# Patient Record
Sex: Female | Born: 1935
Health system: Southern US, Community
[De-identification: ages and names within clinical notes are randomized; demographics above are authoritative.]

## PROBLEM LIST (undated history)

## (undated) DIAGNOSIS — I10 Essential (primary) hypertension: Secondary | ICD-10-CM

## (undated) DIAGNOSIS — E039 Hypothyroidism, unspecified: Secondary | ICD-10-CM

## (undated) DIAGNOSIS — IMO0001 Reserved for inherently not codable concepts without codable children: Secondary | ICD-10-CM

## (undated) DIAGNOSIS — R233 Spontaneous ecchymoses: Secondary | ICD-10-CM

## (undated) DIAGNOSIS — K219 Gastro-esophageal reflux disease without esophagitis: Secondary | ICD-10-CM

## (undated) DIAGNOSIS — R238 Other skin changes: Secondary | ICD-10-CM

## (undated) DIAGNOSIS — M1990 Unspecified osteoarthritis, unspecified site: Secondary | ICD-10-CM

## (undated) DIAGNOSIS — C801 Malignant (primary) neoplasm, unspecified: Secondary | ICD-10-CM

## (undated) DIAGNOSIS — G5600 Carpal tunnel syndrome, unspecified upper limb: Secondary | ICD-10-CM

## (undated) DIAGNOSIS — N329 Bladder disorder, unspecified: Secondary | ICD-10-CM

## (undated) DIAGNOSIS — R413 Other amnesia: Secondary | ICD-10-CM

## (undated) DIAGNOSIS — M810 Age-related osteoporosis without current pathological fracture: Secondary | ICD-10-CM

## (undated) DIAGNOSIS — E079 Disorder of thyroid, unspecified: Secondary | ICD-10-CM

## (undated) DIAGNOSIS — E119 Type 2 diabetes mellitus without complications: Secondary | ICD-10-CM

## (undated) DIAGNOSIS — M7989 Other specified soft tissue disorders: Secondary | ICD-10-CM

## (undated) DIAGNOSIS — C189 Malignant neoplasm of colon, unspecified: Secondary | ICD-10-CM

## (undated) DIAGNOSIS — R5383 Other fatigue: Secondary | ICD-10-CM

## (undated) HISTORY — DX: Type 2 diabetes mellitus without complications: E11.9

## (undated) HISTORY — DX: Disorder of thyroid, unspecified: E07.9

## (undated) HISTORY — DX: Reserved for inherently not codable concepts without codable children: IMO0001

## (undated) HISTORY — DX: Gastro-esophageal reflux disease without esophagitis: K21.9

## (undated) HISTORY — PX: EYE SURGERY: SHX253

## (undated) HISTORY — DX: Bladder disorder, unspecified: N32.9

## (undated) HISTORY — DX: Carpal tunnel syndrome, unspecified upper limb: G56.00

## (undated) HISTORY — PX: ABDOMINAL HYSTERECTOMY: SHX81

## (undated) HISTORY — DX: Other specified soft tissue disorders: M79.89

## (undated) HISTORY — DX: Spontaneous ecchymoses: R23.3

## (undated) HISTORY — DX: Other amnesia: R41.3

## (undated) HISTORY — DX: Other fatigue: R53.83

## (undated) HISTORY — DX: Essential (primary) hypertension: I10

## (undated) HISTORY — DX: Other skin changes: R23.8

## (undated) HISTORY — PX: CHOLECYSTECTOMY: SHX55

## (undated) HISTORY — DX: Age-related osteoporosis without current pathological fracture: M81.0

## (undated) HISTORY — DX: Unspecified osteoarthritis, unspecified site: M19.90

---
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
## 2003-09-01 ENCOUNTER — Encounter: Admission: RE | Admit: 2003-09-01 | Discharge: 2003-09-01 | Payer: Self-pay | Admitting: Internal Medicine

## 2003-10-13 ENCOUNTER — Encounter: Admission: RE | Admit: 2003-10-13 | Discharge: 2003-10-13 | Payer: Self-pay | Admitting: Internal Medicine

## 2003-10-20 ENCOUNTER — Encounter: Admission: RE | Admit: 2003-10-20 | Discharge: 2003-10-20 | Payer: Self-pay | Admitting: Internal Medicine

## 2003-11-17 ENCOUNTER — Encounter: Admission: RE | Admit: 2003-11-17 | Discharge: 2003-11-17 | Payer: Self-pay | Admitting: Internal Medicine

## 2006-04-09 ENCOUNTER — Encounter: Admission: RE | Admit: 2006-04-09 | Discharge: 2006-04-09 | Payer: Self-pay | Admitting: Internal Medicine

## 2006-05-28 ENCOUNTER — Encounter: Admission: RE | Admit: 2006-05-28 | Discharge: 2006-05-28 | Payer: Self-pay | Admitting: Orthopaedic Surgery

## 2006-06-12 ENCOUNTER — Encounter: Admission: RE | Admit: 2006-06-12 | Discharge: 2006-06-12 | Payer: Self-pay | Admitting: Orthopaedic Surgery

## 2006-07-22 ENCOUNTER — Encounter: Admission: RE | Admit: 2006-07-22 | Discharge: 2006-07-22 | Payer: Self-pay | Admitting: Internal Medicine

## 2007-03-29 ENCOUNTER — Encounter: Admission: RE | Admit: 2007-03-29 | Discharge: 2007-03-29 | Payer: Self-pay | Admitting: Internal Medicine

## 2008-07-27 ENCOUNTER — Encounter: Admission: RE | Admit: 2008-07-27 | Discharge: 2008-07-27 | Payer: Self-pay | Admitting: Internal Medicine

## 2009-09-21 ENCOUNTER — Ambulatory Visit: Payer: Self-pay | Admitting: Internal Medicine

## 2010-10-15 ENCOUNTER — Ambulatory Visit: Payer: Self-pay | Admitting: Internal Medicine

## 2011-03-22 DIAGNOSIS — H2510 Age-related nuclear cataract, unspecified eye: Secondary | ICD-10-CM | POA: Diagnosis not present

## 2011-04-12 DIAGNOSIS — H2510 Age-related nuclear cataract, unspecified eye: Secondary | ICD-10-CM | POA: Diagnosis not present

## 2011-04-24 ENCOUNTER — Ambulatory Visit: Payer: Self-pay | Admitting: Ophthalmology

## 2011-04-24 DIAGNOSIS — H2510 Age-related nuclear cataract, unspecified eye: Secondary | ICD-10-CM | POA: Diagnosis not present

## 2011-04-24 DIAGNOSIS — E119 Type 2 diabetes mellitus without complications: Secondary | ICD-10-CM | POA: Diagnosis not present

## 2011-04-24 DIAGNOSIS — K219 Gastro-esophageal reflux disease without esophagitis: Secondary | ICD-10-CM | POA: Diagnosis not present

## 2011-04-24 DIAGNOSIS — I498 Other specified cardiac arrhythmias: Secondary | ICD-10-CM | POA: Diagnosis not present

## 2011-04-24 DIAGNOSIS — M47812 Spondylosis without myelopathy or radiculopathy, cervical region: Secondary | ICD-10-CM | POA: Diagnosis not present

## 2011-04-24 DIAGNOSIS — Z7982 Long term (current) use of aspirin: Secondary | ICD-10-CM | POA: Diagnosis not present

## 2011-04-24 DIAGNOSIS — I10 Essential (primary) hypertension: Secondary | ICD-10-CM | POA: Diagnosis not present

## 2011-04-24 DIAGNOSIS — E7800 Pure hypercholesterolemia, unspecified: Secondary | ICD-10-CM | POA: Diagnosis not present

## 2011-04-24 DIAGNOSIS — Z87891 Personal history of nicotine dependence: Secondary | ICD-10-CM | POA: Diagnosis not present

## 2011-04-24 DIAGNOSIS — E079 Disorder of thyroid, unspecified: Secondary | ICD-10-CM | POA: Diagnosis not present

## 2011-04-24 DIAGNOSIS — M479 Spondylosis, unspecified: Secondary | ICD-10-CM | POA: Diagnosis not present

## 2011-04-24 DIAGNOSIS — Z9109 Other allergy status, other than to drugs and biological substances: Secondary | ICD-10-CM | POA: Diagnosis not present

## 2011-04-24 DIAGNOSIS — Z79899 Other long term (current) drug therapy: Secondary | ICD-10-CM | POA: Diagnosis not present

## 2011-05-14 DIAGNOSIS — E785 Hyperlipidemia, unspecified: Secondary | ICD-10-CM | POA: Diagnosis not present

## 2011-05-14 DIAGNOSIS — E039 Hypothyroidism, unspecified: Secondary | ICD-10-CM | POA: Diagnosis not present

## 2011-05-14 DIAGNOSIS — I10 Essential (primary) hypertension: Secondary | ICD-10-CM | POA: Diagnosis not present

## 2011-05-14 DIAGNOSIS — M810 Age-related osteoporosis without current pathological fracture: Secondary | ICD-10-CM | POA: Diagnosis not present

## 2011-05-21 DIAGNOSIS — I10 Essential (primary) hypertension: Secondary | ICD-10-CM | POA: Diagnosis not present

## 2011-05-21 DIAGNOSIS — E119 Type 2 diabetes mellitus without complications: Secondary | ICD-10-CM | POA: Diagnosis not present

## 2011-07-18 DIAGNOSIS — H2510 Age-related nuclear cataract, unspecified eye: Secondary | ICD-10-CM | POA: Diagnosis not present

## 2011-07-31 ENCOUNTER — Ambulatory Visit: Payer: Self-pay | Admitting: Ophthalmology

## 2011-07-31 DIAGNOSIS — K219 Gastro-esophageal reflux disease without esophagitis: Secondary | ICD-10-CM | POA: Diagnosis not present

## 2011-07-31 DIAGNOSIS — G43909 Migraine, unspecified, not intractable, without status migrainosus: Secondary | ICD-10-CM | POA: Diagnosis not present

## 2011-07-31 DIAGNOSIS — M810 Age-related osteoporosis without current pathological fracture: Secondary | ICD-10-CM | POA: Diagnosis not present

## 2011-07-31 DIAGNOSIS — I10 Essential (primary) hypertension: Secondary | ICD-10-CM | POA: Diagnosis not present

## 2011-07-31 DIAGNOSIS — E079 Disorder of thyroid, unspecified: Secondary | ICD-10-CM | POA: Diagnosis not present

## 2011-07-31 DIAGNOSIS — E7800 Pure hypercholesterolemia, unspecified: Secondary | ICD-10-CM | POA: Diagnosis not present

## 2011-07-31 DIAGNOSIS — Z9109 Other allergy status, other than to drugs and biological substances: Secondary | ICD-10-CM | POA: Diagnosis not present

## 2011-07-31 DIAGNOSIS — Z79899 Other long term (current) drug therapy: Secondary | ICD-10-CM | POA: Diagnosis not present

## 2011-07-31 DIAGNOSIS — M47812 Spondylosis without myelopathy or radiculopathy, cervical region: Secondary | ICD-10-CM | POA: Diagnosis not present

## 2011-07-31 DIAGNOSIS — I498 Other specified cardiac arrhythmias: Secondary | ICD-10-CM | POA: Diagnosis not present

## 2011-07-31 DIAGNOSIS — M479 Spondylosis, unspecified: Secondary | ICD-10-CM | POA: Diagnosis not present

## 2011-07-31 DIAGNOSIS — H2510 Age-related nuclear cataract, unspecified eye: Secondary | ICD-10-CM | POA: Diagnosis not present

## 2011-07-31 DIAGNOSIS — Z7982 Long term (current) use of aspirin: Secondary | ICD-10-CM | POA: Diagnosis not present

## 2011-07-31 DIAGNOSIS — E119 Type 2 diabetes mellitus without complications: Secondary | ICD-10-CM | POA: Diagnosis not present

## 2011-07-31 DIAGNOSIS — Z87891 Personal history of nicotine dependence: Secondary | ICD-10-CM | POA: Diagnosis not present

## 2011-09-05 DIAGNOSIS — E039 Hypothyroidism, unspecified: Secondary | ICD-10-CM | POA: Diagnosis not present

## 2011-09-05 DIAGNOSIS — I10 Essential (primary) hypertension: Secondary | ICD-10-CM | POA: Diagnosis not present

## 2011-09-10 DIAGNOSIS — E119 Type 2 diabetes mellitus without complications: Secondary | ICD-10-CM | POA: Diagnosis not present

## 2011-09-10 DIAGNOSIS — E785 Hyperlipidemia, unspecified: Secondary | ICD-10-CM | POA: Diagnosis not present

## 2011-09-10 DIAGNOSIS — I10 Essential (primary) hypertension: Secondary | ICD-10-CM | POA: Diagnosis not present

## 2011-10-01 DIAGNOSIS — I10 Essential (primary) hypertension: Secondary | ICD-10-CM | POA: Diagnosis not present

## 2011-10-01 DIAGNOSIS — S139XXA Sprain of joints and ligaments of unspecified parts of neck, initial encounter: Secondary | ICD-10-CM | POA: Diagnosis not present

## 2011-10-01 DIAGNOSIS — E119 Type 2 diabetes mellitus without complications: Secondary | ICD-10-CM | POA: Diagnosis not present

## 2011-10-21 ENCOUNTER — Ambulatory Visit: Payer: Self-pay | Admitting: Internal Medicine

## 2011-10-21 DIAGNOSIS — Z1231 Encounter for screening mammogram for malignant neoplasm of breast: Secondary | ICD-10-CM | POA: Diagnosis not present

## 2011-10-22 DIAGNOSIS — Z23 Encounter for immunization: Secondary | ICD-10-CM | POA: Diagnosis not present

## 2012-01-01 DIAGNOSIS — E785 Hyperlipidemia, unspecified: Secondary | ICD-10-CM | POA: Diagnosis not present

## 2012-01-01 DIAGNOSIS — E039 Hypothyroidism, unspecified: Secondary | ICD-10-CM | POA: Diagnosis not present

## 2012-01-01 DIAGNOSIS — E119 Type 2 diabetes mellitus without complications: Secondary | ICD-10-CM | POA: Diagnosis not present

## 2012-01-10 DIAGNOSIS — N949 Unspecified condition associated with female genital organs and menstrual cycle: Secondary | ICD-10-CM | POA: Diagnosis not present

## 2012-01-10 DIAGNOSIS — I10 Essential (primary) hypertension: Secondary | ICD-10-CM | POA: Diagnosis not present

## 2012-01-10 DIAGNOSIS — R82998 Other abnormal findings in urine: Secondary | ICD-10-CM | POA: Diagnosis not present

## 2012-01-10 DIAGNOSIS — E119 Type 2 diabetes mellitus without complications: Secondary | ICD-10-CM | POA: Diagnosis not present

## 2012-02-20 DIAGNOSIS — M899 Disorder of bone, unspecified: Secondary | ICD-10-CM | POA: Diagnosis not present

## 2012-03-05 DIAGNOSIS — E119 Type 2 diabetes mellitus without complications: Secondary | ICD-10-CM | POA: Diagnosis not present

## 2012-03-05 DIAGNOSIS — E785 Hyperlipidemia, unspecified: Secondary | ICD-10-CM | POA: Diagnosis not present

## 2012-03-05 DIAGNOSIS — E039 Hypothyroidism, unspecified: Secondary | ICD-10-CM | POA: Diagnosis not present

## 2012-03-09 DIAGNOSIS — E119 Type 2 diabetes mellitus without complications: Secondary | ICD-10-CM | POA: Diagnosis not present

## 2012-03-12 DIAGNOSIS — M1990 Unspecified osteoarthritis, unspecified site: Secondary | ICD-10-CM | POA: Diagnosis not present

## 2012-03-12 DIAGNOSIS — E119 Type 2 diabetes mellitus without complications: Secondary | ICD-10-CM | POA: Diagnosis not present

## 2012-03-12 DIAGNOSIS — Z23 Encounter for immunization: Secondary | ICD-10-CM | POA: Diagnosis not present

## 2012-04-15 DIAGNOSIS — M722 Plantar fascial fibromatosis: Secondary | ICD-10-CM | POA: Diagnosis not present

## 2012-04-15 DIAGNOSIS — M779 Enthesopathy, unspecified: Secondary | ICD-10-CM | POA: Diagnosis not present

## 2012-06-04 DIAGNOSIS — L821 Other seborrheic keratosis: Secondary | ICD-10-CM | POA: Diagnosis not present

## 2012-06-09 DIAGNOSIS — E039 Hypothyroidism, unspecified: Secondary | ICD-10-CM | POA: Diagnosis not present

## 2012-06-09 DIAGNOSIS — E559 Vitamin D deficiency, unspecified: Secondary | ICD-10-CM | POA: Diagnosis not present

## 2012-06-09 DIAGNOSIS — E119 Type 2 diabetes mellitus without complications: Secondary | ICD-10-CM | POA: Diagnosis not present

## 2012-06-09 DIAGNOSIS — I10 Essential (primary) hypertension: Secondary | ICD-10-CM | POA: Diagnosis not present

## 2012-06-16 DIAGNOSIS — M545 Low back pain: Secondary | ICD-10-CM | POA: Diagnosis not present

## 2012-06-16 DIAGNOSIS — I10 Essential (primary) hypertension: Secondary | ICD-10-CM | POA: Diagnosis not present

## 2012-06-16 DIAGNOSIS — M1990 Unspecified osteoarthritis, unspecified site: Secondary | ICD-10-CM | POA: Diagnosis not present

## 2012-06-16 DIAGNOSIS — E785 Hyperlipidemia, unspecified: Secondary | ICD-10-CM | POA: Diagnosis not present

## 2012-06-16 DIAGNOSIS — J309 Allergic rhinitis, unspecified: Secondary | ICD-10-CM | POA: Diagnosis not present

## 2012-06-16 DIAGNOSIS — E039 Hypothyroidism, unspecified: Secondary | ICD-10-CM | POA: Diagnosis not present

## 2012-06-16 DIAGNOSIS — E1169 Type 2 diabetes mellitus with other specified complication: Secondary | ICD-10-CM | POA: Diagnosis not present

## 2012-08-03 DIAGNOSIS — M722 Plantar fascial fibromatosis: Secondary | ICD-10-CM | POA: Diagnosis not present

## 2012-08-31 DIAGNOSIS — H6120 Impacted cerumen, unspecified ear: Secondary | ICD-10-CM | POA: Diagnosis not present

## 2012-08-31 DIAGNOSIS — H903 Sensorineural hearing loss, bilateral: Secondary | ICD-10-CM | POA: Diagnosis not present

## 2012-08-31 DIAGNOSIS — H9209 Otalgia, unspecified ear: Secondary | ICD-10-CM | POA: Diagnosis not present

## 2012-09-14 DIAGNOSIS — E559 Vitamin D deficiency, unspecified: Secondary | ICD-10-CM | POA: Diagnosis not present

## 2012-09-14 DIAGNOSIS — E785 Hyperlipidemia, unspecified: Secondary | ICD-10-CM | POA: Diagnosis not present

## 2012-09-14 DIAGNOSIS — E1169 Type 2 diabetes mellitus with other specified complication: Secondary | ICD-10-CM | POA: Diagnosis not present

## 2012-09-14 DIAGNOSIS — E039 Hypothyroidism, unspecified: Secondary | ICD-10-CM | POA: Diagnosis not present

## 2012-09-21 DIAGNOSIS — Z1212 Encounter for screening for malignant neoplasm of rectum: Secondary | ICD-10-CM | POA: Diagnosis not present

## 2012-09-29 DIAGNOSIS — M79609 Pain in unspecified limb: Secondary | ICD-10-CM | POA: Diagnosis not present

## 2012-09-29 DIAGNOSIS — Z23 Encounter for immunization: Secondary | ICD-10-CM | POA: Diagnosis not present

## 2012-09-29 DIAGNOSIS — M1990 Unspecified osteoarthritis, unspecified site: Secondary | ICD-10-CM | POA: Diagnosis not present

## 2012-09-29 DIAGNOSIS — J309 Allergic rhinitis, unspecified: Secondary | ICD-10-CM | POA: Diagnosis not present

## 2012-09-29 DIAGNOSIS — Z0000 Encounter for general adult medical examination without abnormal findings: Secondary | ICD-10-CM | POA: Diagnosis not present

## 2012-09-29 DIAGNOSIS — S139XXA Sprain of joints and ligaments of unspecified parts of neck, initial encounter: Secondary | ICD-10-CM | POA: Diagnosis not present

## 2012-09-29 DIAGNOSIS — R350 Frequency of micturition: Secondary | ICD-10-CM | POA: Diagnosis not present

## 2012-10-27 ENCOUNTER — Ambulatory Visit: Payer: Self-pay | Admitting: Internal Medicine

## 2012-10-27 DIAGNOSIS — Z1231 Encounter for screening mammogram for malignant neoplasm of breast: Secondary | ICD-10-CM | POA: Diagnosis not present

## 2012-11-03 DIAGNOSIS — M810 Age-related osteoporosis without current pathological fracture: Secondary | ICD-10-CM | POA: Diagnosis not present

## 2013-01-14 ENCOUNTER — Encounter: Payer: Self-pay | Admitting: Podiatry

## 2013-01-18 ENCOUNTER — Encounter: Payer: Self-pay | Admitting: Podiatry

## 2013-01-18 ENCOUNTER — Ambulatory Visit (INDEPENDENT_AMBULATORY_CARE_PROVIDER_SITE_OTHER): Payer: Medicare Other

## 2013-01-18 ENCOUNTER — Ambulatory Visit (INDEPENDENT_AMBULATORY_CARE_PROVIDER_SITE_OTHER): Payer: PRIVATE HEALTH INSURANCE | Admitting: Podiatry

## 2013-01-18 VITALS — BP 110/54 | HR 93 | Resp 16 | Ht 65.0 in | Wt 140.0 lb

## 2013-01-18 DIAGNOSIS — M79609 Pain in unspecified limb: Secondary | ICD-10-CM | POA: Diagnosis not present

## 2013-01-18 DIAGNOSIS — M79672 Pain in left foot: Secondary | ICD-10-CM

## 2013-01-18 DIAGNOSIS — M7750 Other enthesopathy of unspecified foot: Secondary | ICD-10-CM

## 2013-01-18 NOTE — Progress Notes (Signed)
Rebecca Sellers presents today as an 79 year old white female about to leave for [place] for the Christmas holiday. She states that a few weeks ago she rolled her left ankle and is become more painful. Having pain right in here she points to the sinus tarsi area of her left foot. She states it was swollen but was not black and blue. She has not done anything to try to help it.  Objective: Vital signs are stable she is alert and oriented x3. Pulses are strongly palpable left lower extremity. Capillary fill time to digits one through 5 of the left foot is immediate and the foot is warm to the touch. She has mild edema overlying these lateral ankle and the sinus tarsi area of the left foot. Radiographically evaluation does demonstrate subtalar joint capsulitis and osteoarthritis with midfoot osteoarthritis and flatfoot deformity. Tenderness on palpation of the sinus tarsi and the inability to invert the left foot along the middle facet of the subtalar joint is indicative of osteoarthritis.  Assessment: Osteoarthritis subtalar joint capsulitis left.  Plan: Injected the subtalar joint today her sterile Betadine skin prep, with Kenalog and local anesthetic we'll followup with her in the near future.

## 2013-03-25 DIAGNOSIS — D649 Anemia, unspecified: Secondary | ICD-10-CM | POA: Diagnosis not present

## 2013-03-25 DIAGNOSIS — E039 Hypothyroidism, unspecified: Secondary | ICD-10-CM | POA: Diagnosis not present

## 2013-03-25 DIAGNOSIS — R413 Other amnesia: Secondary | ICD-10-CM | POA: Diagnosis not present

## 2013-03-25 DIAGNOSIS — I10 Essential (primary) hypertension: Secondary | ICD-10-CM | POA: Diagnosis not present

## 2013-03-25 DIAGNOSIS — Z0000 Encounter for general adult medical examination without abnormal findings: Secondary | ICD-10-CM | POA: Diagnosis not present

## 2013-03-25 DIAGNOSIS — E119 Type 2 diabetes mellitus without complications: Secondary | ICD-10-CM | POA: Diagnosis not present

## 2013-03-29 DIAGNOSIS — I10 Essential (primary) hypertension: Secondary | ICD-10-CM | POA: Diagnosis not present

## 2013-04-05 DIAGNOSIS — E039 Hypothyroidism, unspecified: Secondary | ICD-10-CM | POA: Diagnosis not present

## 2013-04-05 DIAGNOSIS — I10 Essential (primary) hypertension: Secondary | ICD-10-CM | POA: Diagnosis not present

## 2013-04-05 DIAGNOSIS — J309 Allergic rhinitis, unspecified: Secondary | ICD-10-CM | POA: Diagnosis not present

## 2013-04-05 DIAGNOSIS — K219 Gastro-esophageal reflux disease without esophagitis: Secondary | ICD-10-CM | POA: Diagnosis not present

## 2013-04-05 DIAGNOSIS — N184 Chronic kidney disease, stage 4 (severe): Secondary | ICD-10-CM | POA: Diagnosis not present

## 2013-04-05 DIAGNOSIS — M1990 Unspecified osteoarthritis, unspecified site: Secondary | ICD-10-CM | POA: Diagnosis not present

## 2013-04-05 DIAGNOSIS — E119 Type 2 diabetes mellitus without complications: Secondary | ICD-10-CM | POA: Diagnosis not present

## 2013-04-05 DIAGNOSIS — E785 Hyperlipidemia, unspecified: Secondary | ICD-10-CM | POA: Diagnosis not present

## 2013-04-08 ENCOUNTER — Ambulatory Visit (INDEPENDENT_AMBULATORY_CARE_PROVIDER_SITE_OTHER): Payer: Medicare Other | Admitting: Podiatry

## 2013-04-08 ENCOUNTER — Encounter: Payer: Self-pay | Admitting: Podiatry

## 2013-04-08 VITALS — BP 145/88 | HR 74 | Resp 16 | Wt 140.0 lb

## 2013-04-08 DIAGNOSIS — M79672 Pain in left foot: Secondary | ICD-10-CM

## 2013-04-08 DIAGNOSIS — M778 Other enthesopathies, not elsewhere classified: Secondary | ICD-10-CM

## 2013-04-08 DIAGNOSIS — M76829 Posterior tibial tendinitis, unspecified leg: Secondary | ICD-10-CM

## 2013-04-08 DIAGNOSIS — M7750 Other enthesopathy of unspecified foot: Secondary | ICD-10-CM | POA: Diagnosis not present

## 2013-04-08 DIAGNOSIS — M79609 Pain in unspecified limb: Secondary | ICD-10-CM | POA: Diagnosis not present

## 2013-04-08 DIAGNOSIS — M779 Enthesopathy, unspecified: Secondary | ICD-10-CM

## 2013-04-08 NOTE — Progress Notes (Signed)
Rebecca Sellers presents today complaining of pain to her dorsal aspect of her right foot. She states that the osteoarthritis overlying the right foot is becoming more painful and she is requesting an injection. She's also complaining of pain to the medial aspect of the left foot and ankle.  Objective: Vital signs are stable she is alert and oriented x3. Pulses are strongly palpable bilateral. Dorsal spurring and swelling overlying the midfoot right is indicative of osteoarthritis. She also has what appears to be a tear or rupture of her posterior tibial tendon of her left foot.  Assessment: Osteoarthritis dorsal aspect right foot. Possible tear or rupture of the posterior tibial tendon of the left foot.  Plan: Discussed etiology pathology conservative versus surgical therapies. She does not want the left foot image however she would like an injection to both feet. I injected the dorsal aspect of the right foot with Kenalog and local anesthetic and injected the medial aspect of the left foot with dexamethasone 2 mg. I will followup with her in the near future. She was also dispensed a tri-lock brace for her left foot and ankle.

## 2013-04-19 DIAGNOSIS — E119 Type 2 diabetes mellitus without complications: Secondary | ICD-10-CM | POA: Diagnosis not present

## 2013-05-04 DIAGNOSIS — M810 Age-related osteoporosis without current pathological fracture: Secondary | ICD-10-CM | POA: Diagnosis not present

## 2013-05-04 DIAGNOSIS — N184 Chronic kidney disease, stage 4 (severe): Secondary | ICD-10-CM | POA: Diagnosis not present

## 2013-05-17 ENCOUNTER — Encounter: Payer: Self-pay | Admitting: Podiatry

## 2013-05-17 ENCOUNTER — Ambulatory Visit (INDEPENDENT_AMBULATORY_CARE_PROVIDER_SITE_OTHER): Payer: Medicare Other | Admitting: Podiatry

## 2013-05-17 VITALS — BP 147/85 | HR 81 | Resp 16

## 2013-05-17 DIAGNOSIS — M6740 Ganglion, unspecified site: Secondary | ICD-10-CM

## 2013-05-17 DIAGNOSIS — M76829 Posterior tibial tendinitis, unspecified leg: Secondary | ICD-10-CM

## 2013-05-17 NOTE — Progress Notes (Signed)
She presents today for followup of her posterior tibial tendinitis of her left foot. She states is doing miraculously better however is still sore right here she points along the course of the posterior tibial tendon of the left foot.  Objective: Vital signs are stable she is alert and oriented x3. She has pain on palpation and there is fluid in the tendon sheath.  Assessment: Posterior tibial tendon dysfunction with posterior tibial tendinitis and synovitis.  Plan: I injected 2 mg of dexamethasone into the tendon sheath today and placed her back in her splint I will followup with her in one month if necessary

## 2013-05-28 DIAGNOSIS — H43819 Vitreous degeneration, unspecified eye: Secondary | ICD-10-CM | POA: Diagnosis not present

## 2013-05-31 DIAGNOSIS — H33309 Unspecified retinal break, unspecified eye: Secondary | ICD-10-CM | POA: Diagnosis not present

## 2013-08-30 DIAGNOSIS — R252 Cramp and spasm: Secondary | ICD-10-CM | POA: Diagnosis not present

## 2013-08-30 DIAGNOSIS — N3020 Other chronic cystitis without hematuria: Secondary | ICD-10-CM | POA: Diagnosis not present

## 2013-08-30 DIAGNOSIS — R350 Frequency of micturition: Secondary | ICD-10-CM | POA: Diagnosis not present

## 2013-08-30 DIAGNOSIS — R809 Proteinuria, unspecified: Secondary | ICD-10-CM | POA: Diagnosis not present

## 2013-08-30 DIAGNOSIS — N184 Chronic kidney disease, stage 4 (severe): Secondary | ICD-10-CM | POA: Diagnosis not present

## 2013-08-30 DIAGNOSIS — E119 Type 2 diabetes mellitus without complications: Secondary | ICD-10-CM | POA: Diagnosis not present

## 2013-08-30 DIAGNOSIS — I10 Essential (primary) hypertension: Secondary | ICD-10-CM | POA: Diagnosis not present

## 2013-08-30 DIAGNOSIS — R82998 Other abnormal findings in urine: Secondary | ICD-10-CM | POA: Diagnosis not present

## 2013-09-24 DIAGNOSIS — E785 Hyperlipidemia, unspecified: Secondary | ICD-10-CM | POA: Diagnosis not present

## 2013-09-24 DIAGNOSIS — Z79899 Other long term (current) drug therapy: Secondary | ICD-10-CM | POA: Diagnosis not present

## 2013-09-24 DIAGNOSIS — E039 Hypothyroidism, unspecified: Secondary | ICD-10-CM | POA: Diagnosis not present

## 2013-09-24 DIAGNOSIS — E559 Vitamin D deficiency, unspecified: Secondary | ICD-10-CM | POA: Diagnosis not present

## 2013-09-24 DIAGNOSIS — M810 Age-related osteoporosis without current pathological fracture: Secondary | ICD-10-CM | POA: Diagnosis not present

## 2013-09-24 DIAGNOSIS — I10 Essential (primary) hypertension: Secondary | ICD-10-CM | POA: Diagnosis not present

## 2013-09-24 DIAGNOSIS — N184 Chronic kidney disease, stage 4 (severe): Secondary | ICD-10-CM | POA: Diagnosis not present

## 2013-09-24 DIAGNOSIS — E119 Type 2 diabetes mellitus without complications: Secondary | ICD-10-CM | POA: Diagnosis not present

## 2013-09-29 ENCOUNTER — Ambulatory Visit (INDEPENDENT_AMBULATORY_CARE_PROVIDER_SITE_OTHER): Payer: Medicare Other | Admitting: Podiatry

## 2013-09-29 ENCOUNTER — Ambulatory Visit (INDEPENDENT_AMBULATORY_CARE_PROVIDER_SITE_OTHER): Payer: Medicare Other

## 2013-09-29 VITALS — BP 139/71 | HR 73 | Resp 16

## 2013-09-29 DIAGNOSIS — M778 Other enthesopathies, not elsewhere classified: Secondary | ICD-10-CM

## 2013-09-29 DIAGNOSIS — M779 Enthesopathy, unspecified: Secondary | ICD-10-CM

## 2013-09-29 DIAGNOSIS — M7750 Other enthesopathy of unspecified foot: Secondary | ICD-10-CM

## 2013-09-29 DIAGNOSIS — M7751 Other enthesopathy of right foot: Secondary | ICD-10-CM

## 2013-09-29 NOTE — Progress Notes (Signed)
She presents today complaining of pain across the dorsal aspect of her bilateral foot as she has in the past she has osteoarthritis and she would request an injection periodically. She's also having pain to the lateral aspect of her right foot about the fifth metatarsophalangeal joint area.  Objective: Pulses are strongly palpable bilateral. She has pain on palpation dorsal aspect of the bilateral foot. Radiographic evaluation of the right foot demonstrates severe osteoarthritis of the midfoot and probable bursitis of the fifth metatarsophalangeal joint area.  Assessment: Osteoarthritis dorsal aspect of the bilateral foot capsulitis. Bursitis fifth metatarsophalangeal joint right foot.  Plan: Injected Kenalog to the dorsal aspect of the bilateral foot. Injected dexamethasone to the point of maximal tenderness right foot.

## 2013-10-01 DIAGNOSIS — N184 Chronic kidney disease, stage 4 (severe): Secondary | ICD-10-CM | POA: Diagnosis not present

## 2013-10-01 DIAGNOSIS — M1990 Unspecified osteoarthritis, unspecified site: Secondary | ICD-10-CM | POA: Diagnosis not present

## 2013-10-01 DIAGNOSIS — Z23 Encounter for immunization: Secondary | ICD-10-CM | POA: Diagnosis not present

## 2013-10-01 DIAGNOSIS — R252 Cramp and spasm: Secondary | ICD-10-CM | POA: Diagnosis not present

## 2013-10-01 DIAGNOSIS — Z0000 Encounter for general adult medical examination without abnormal findings: Secondary | ICD-10-CM | POA: Diagnosis not present

## 2013-10-01 DIAGNOSIS — M545 Low back pain, unspecified: Secondary | ICD-10-CM | POA: Diagnosis not present

## 2013-10-01 DIAGNOSIS — I10 Essential (primary) hypertension: Secondary | ICD-10-CM | POA: Diagnosis not present

## 2013-10-01 DIAGNOSIS — I8390 Asymptomatic varicose veins of unspecified lower extremity: Secondary | ICD-10-CM | POA: Diagnosis not present

## 2013-10-04 DIAGNOSIS — Z1212 Encounter for screening for malignant neoplasm of rectum: Secondary | ICD-10-CM | POA: Diagnosis not present

## 2013-10-13 DIAGNOSIS — D3130 Benign neoplasm of unspecified choroid: Secondary | ICD-10-CM | POA: Diagnosis not present

## 2013-10-13 DIAGNOSIS — H33309 Unspecified retinal break, unspecified eye: Secondary | ICD-10-CM | POA: Diagnosis not present

## 2013-11-08 ENCOUNTER — Ambulatory Visit: Payer: Self-pay | Admitting: Internal Medicine

## 2013-11-08 DIAGNOSIS — Z1231 Encounter for screening mammogram for malignant neoplasm of breast: Secondary | ICD-10-CM | POA: Diagnosis not present

## 2013-11-09 DIAGNOSIS — Z6824 Body mass index (BMI) 24.0-24.9, adult: Secondary | ICD-10-CM | POA: Diagnosis not present

## 2013-11-09 DIAGNOSIS — M810 Age-related osteoporosis without current pathological fracture: Secondary | ICD-10-CM | POA: Diagnosis not present

## 2013-11-09 DIAGNOSIS — Z23 Encounter for immunization: Secondary | ICD-10-CM | POA: Diagnosis not present

## 2014-02-08 DIAGNOSIS — E039 Hypothyroidism, unspecified: Secondary | ICD-10-CM | POA: Diagnosis not present

## 2014-02-08 DIAGNOSIS — E785 Hyperlipidemia, unspecified: Secondary | ICD-10-CM | POA: Diagnosis not present

## 2014-02-08 DIAGNOSIS — E119 Type 2 diabetes mellitus without complications: Secondary | ICD-10-CM | POA: Diagnosis not present

## 2014-02-08 DIAGNOSIS — I10 Essential (primary) hypertension: Secondary | ICD-10-CM | POA: Diagnosis not present

## 2014-02-15 DIAGNOSIS — R209 Unspecified disturbances of skin sensation: Secondary | ICD-10-CM | POA: Diagnosis not present

## 2014-02-15 DIAGNOSIS — L299 Pruritus, unspecified: Secondary | ICD-10-CM | POA: Diagnosis not present

## 2014-02-15 DIAGNOSIS — Z1389 Encounter for screening for other disorder: Secondary | ICD-10-CM | POA: Diagnosis not present

## 2014-02-15 DIAGNOSIS — E119 Type 2 diabetes mellitus without complications: Secondary | ICD-10-CM | POA: Diagnosis not present

## 2014-02-15 DIAGNOSIS — J309 Allergic rhinitis, unspecified: Secondary | ICD-10-CM | POA: Diagnosis not present

## 2014-02-15 DIAGNOSIS — E039 Hypothyroidism, unspecified: Secondary | ICD-10-CM | POA: Diagnosis not present

## 2014-02-15 DIAGNOSIS — E785 Hyperlipidemia, unspecified: Secondary | ICD-10-CM | POA: Diagnosis not present

## 2014-02-15 DIAGNOSIS — Z6825 Body mass index (BMI) 25.0-25.9, adult: Secondary | ICD-10-CM | POA: Diagnosis not present

## 2014-02-15 DIAGNOSIS — N184 Chronic kidney disease, stage 4 (severe): Secondary | ICD-10-CM | POA: Diagnosis not present

## 2014-02-15 DIAGNOSIS — I10 Essential (primary) hypertension: Secondary | ICD-10-CM | POA: Diagnosis not present

## 2014-03-08 DIAGNOSIS — M810 Age-related osteoporosis without current pathological fracture: Secondary | ICD-10-CM | POA: Diagnosis not present

## 2014-04-18 DIAGNOSIS — D3132 Benign neoplasm of left choroid: Secondary | ICD-10-CM | POA: Diagnosis not present

## 2014-04-18 DIAGNOSIS — E119 Type 2 diabetes mellitus without complications: Secondary | ICD-10-CM | POA: Diagnosis not present

## 2014-04-18 DIAGNOSIS — H43813 Vitreous degeneration, bilateral: Secondary | ICD-10-CM | POA: Diagnosis not present

## 2014-04-25 ENCOUNTER — Encounter: Payer: Self-pay | Admitting: Podiatry

## 2014-04-25 ENCOUNTER — Ambulatory Visit (INDEPENDENT_AMBULATORY_CARE_PROVIDER_SITE_OTHER): Payer: Medicare Other | Admitting: Podiatry

## 2014-04-25 VITALS — BP 137/63 | HR 89 | Resp 16

## 2014-04-25 DIAGNOSIS — M7752 Other enthesopathy of left foot: Secondary | ICD-10-CM | POA: Diagnosis not present

## 2014-04-25 DIAGNOSIS — M7751 Other enthesopathy of right foot: Secondary | ICD-10-CM

## 2014-04-25 DIAGNOSIS — M71571 Other bursitis, not elsewhere classified, right ankle and foot: Secondary | ICD-10-CM

## 2014-04-25 DIAGNOSIS — M779 Enthesopathy, unspecified: Principal | ICD-10-CM

## 2014-04-25 DIAGNOSIS — M778 Other enthesopathies, not elsewhere classified: Secondary | ICD-10-CM

## 2014-04-25 NOTE — Progress Notes (Signed)
She presents today for follow-up of her osteoarthritis bursitis capsulitis. She states that this time for injections my feet are killing me.  Objective: Vital signs are stable she is alert and oriented 3. She has dorsal spurring dorsal aspect of the bilateral foot associated with Lisfranc's arthritis. She also has pain on palpation fifth metatarsophalangeal joint of the right foot secondary to bursitis or capsulitis. No open lesions are noted.  Assessment: Pain in limb secondary to capsulitis and bursitis.  Plan: Injected the dorsal aspect bilateral foot today with Kenalog and local anesthetic. Injected the fifth metatarsal head right foot plantarly with dexamethasone. I will follow-up with her in a few months.

## 2014-05-17 DIAGNOSIS — E785 Hyperlipidemia, unspecified: Secondary | ICD-10-CM | POA: Diagnosis not present

## 2014-05-17 DIAGNOSIS — Z6826 Body mass index (BMI) 26.0-26.9, adult: Secondary | ICD-10-CM | POA: Diagnosis not present

## 2014-05-17 DIAGNOSIS — M810 Age-related osteoporosis without current pathological fracture: Secondary | ICD-10-CM | POA: Diagnosis not present

## 2014-05-17 DIAGNOSIS — K219 Gastro-esophageal reflux disease without esophagitis: Secondary | ICD-10-CM | POA: Diagnosis not present

## 2014-05-24 DIAGNOSIS — H26491 Other secondary cataract, right eye: Secondary | ICD-10-CM | POA: Diagnosis not present

## 2014-06-09 ENCOUNTER — Encounter (HOSPITAL_COMMUNITY): Payer: Self-pay

## 2014-06-13 ENCOUNTER — Other Ambulatory Visit (HOSPITAL_COMMUNITY): Payer: Self-pay | Admitting: *Deleted

## 2014-06-14 ENCOUNTER — Encounter (HOSPITAL_COMMUNITY)
Admission: RE | Admit: 2014-06-14 | Discharge: 2014-06-14 | Disposition: A | Discharge status: Home / Self Care | Payer: Medicare Other | Source: Ambulatory Visit | Attending: Internal Medicine | Admitting: Internal Medicine

## 2014-06-14 DIAGNOSIS — Z008 Encounter for other general examination: Secondary | ICD-10-CM | POA: Diagnosis not present

## 2014-06-14 DIAGNOSIS — E039 Hypothyroidism, unspecified: Secondary | ICD-10-CM | POA: Diagnosis not present

## 2014-06-14 DIAGNOSIS — N390 Urinary tract infection, site not specified: Secondary | ICD-10-CM | POA: Diagnosis not present

## 2014-06-14 DIAGNOSIS — E119 Type 2 diabetes mellitus without complications: Secondary | ICD-10-CM | POA: Diagnosis not present

## 2014-06-14 DIAGNOSIS — M810 Age-related osteoporosis without current pathological fracture: Secondary | ICD-10-CM | POA: Diagnosis not present

## 2014-06-14 DIAGNOSIS — R413 Other amnesia: Secondary | ICD-10-CM | POA: Diagnosis not present

## 2014-06-14 MED ORDER — DENOSUMAB 60 MG/ML ~~LOC~~ SOLN
60.0000 mg | Freq: Once | SUBCUTANEOUS | Status: AC
  Administered 2014-06-14: 60 mg via SUBCUTANEOUS
  Filled 2014-06-14: qty 1

## 2014-06-21 DIAGNOSIS — E039 Hypothyroidism, unspecified: Secondary | ICD-10-CM | POA: Diagnosis not present

## 2014-06-21 DIAGNOSIS — Z6826 Body mass index (BMI) 26.0-26.9, adult: Secondary | ICD-10-CM | POA: Diagnosis not present

## 2014-06-21 DIAGNOSIS — E119 Type 2 diabetes mellitus without complications: Secondary | ICD-10-CM | POA: Diagnosis not present

## 2014-06-21 DIAGNOSIS — M810 Age-related osteoporosis without current pathological fracture: Secondary | ICD-10-CM | POA: Diagnosis not present

## 2014-06-21 DIAGNOSIS — R209 Unspecified disturbances of skin sensation: Secondary | ICD-10-CM | POA: Diagnosis not present

## 2014-06-21 DIAGNOSIS — E785 Hyperlipidemia, unspecified: Secondary | ICD-10-CM | POA: Diagnosis not present

## 2014-06-21 DIAGNOSIS — I10 Essential (primary) hypertension: Secondary | ICD-10-CM | POA: Diagnosis not present

## 2014-06-21 DIAGNOSIS — M5430 Sciatica, unspecified side: Secondary | ICD-10-CM | POA: Diagnosis not present

## 2014-06-21 DIAGNOSIS — N184 Chronic kidney disease, stage 4 (severe): Secondary | ICD-10-CM | POA: Diagnosis not present

## 2014-06-21 DIAGNOSIS — D692 Other nonthrombocytopenic purpura: Secondary | ICD-10-CM | POA: Diagnosis not present

## 2014-08-12 ENCOUNTER — Ambulatory Visit (INDEPENDENT_AMBULATORY_CARE_PROVIDER_SITE_OTHER): Payer: Medicare Other | Admitting: Podiatry

## 2014-08-12 VITALS — BP 120/80 | HR 79 | Resp 16

## 2014-08-12 DIAGNOSIS — M7751 Other enthesopathy of right foot: Secondary | ICD-10-CM

## 2014-08-12 DIAGNOSIS — M778 Other enthesopathies, not elsewhere classified: Secondary | ICD-10-CM

## 2014-08-12 DIAGNOSIS — M7752 Other enthesopathy of left foot: Secondary | ICD-10-CM

## 2014-08-12 DIAGNOSIS — M19079 Primary osteoarthritis, unspecified ankle and foot: Secondary | ICD-10-CM | POA: Diagnosis not present

## 2014-08-12 DIAGNOSIS — M779 Enthesopathy, unspecified: Principal | ICD-10-CM

## 2014-08-12 NOTE — Progress Notes (Signed)
She presents today to complain of pain across the dorsal aspect of the bilateral foot history of osteoarthritis.  Objective: Vital signs stable she is alert and 3 pulses are palpable bilateral. She has osteoarthritis of the midfoot bilateral. Pain on palpation of this area.  Assessment: Capsulitis osteoarthritis midfoot bilateral.  Plan: Reinjected Kenalog McLin aesthetics of one muscle tenderness today

## 2014-08-29 DIAGNOSIS — Z6825 Body mass index (BMI) 25.0-25.9, adult: Secondary | ICD-10-CM | POA: Diagnosis not present

## 2014-08-29 DIAGNOSIS — M5136 Other intervertebral disc degeneration, lumbar region: Secondary | ICD-10-CM | POA: Diagnosis not present

## 2014-08-29 DIAGNOSIS — M545 Low back pain: Secondary | ICD-10-CM | POA: Diagnosis not present

## 2014-08-29 DIAGNOSIS — M4316 Spondylolisthesis, lumbar region: Secondary | ICD-10-CM | POA: Diagnosis not present

## 2014-08-29 DIAGNOSIS — M5430 Sciatica, unspecified side: Secondary | ICD-10-CM | POA: Diagnosis not present

## 2014-08-29 DIAGNOSIS — M25551 Pain in right hip: Secondary | ICD-10-CM | POA: Diagnosis not present

## 2014-09-12 ENCOUNTER — Ambulatory Visit: Payer: Medicare Other | Admitting: Physical Therapy

## 2014-09-13 ENCOUNTER — Ambulatory Visit: Payer: Medicare Other | Attending: Internal Medicine | Admitting: Physical Therapy

## 2014-09-13 DIAGNOSIS — M25562 Pain in left knee: Secondary | ICD-10-CM | POA: Diagnosis not present

## 2014-09-13 DIAGNOSIS — R531 Weakness: Secondary | ICD-10-CM | POA: Diagnosis not present

## 2014-09-13 DIAGNOSIS — R262 Difficulty in walking, not elsewhere classified: Secondary | ICD-10-CM | POA: Diagnosis not present

## 2014-09-13 NOTE — Patient Instructions (Signed)
All exercises provided were adapted from [email or web address].com. Patient was provided a written handout with pictures as described. Any additional cues were manually entered in to handout and copied in to this document.   Supine Clamshell  Begin by lying flat on your back with your knees bent, feet together. The band should be tight around both knees so you're beginning the exercise with tension already. Keeping the feet together, one leg should stabilize while the other is falling out to the side against the resistance. Bring the knee back to center with control and repeat 10 times before switching to the other leg.    Piriformis Stretch  Flex hip up towards body and pull across body towards opposite side.

## 2014-09-14 NOTE — Therapy (Signed)
[place] MAIN [facility] SERVICES [address] [place], [place], [address] Phone: [phone]   Fax:  [phone]  Physical Therapy Evaluation  Patient Details  Name: Rebecca Sellers MRN: [id number] Date of Birth: 1930/11/27 Referring Provider:  Shon Baton, MD  Encounter Date: 09/13/2014      PT End of Session - 09/13/14 1344    Visit Number 1   Number of Visits 13   Date for PT Re-Evaluation 11/08/14   Authorization Type 1   Authorization Time Period 10   PT Start Time 1335   PT Stop Time 1430   PT Time Calculation (min) 55 min   Activity Tolerance Patient tolerated treatment well   Behavior During Therapy [facility] for tasks assessed/performed      Past Medical History  Diagnosis Date  . Osteoarthritis   . Osteoporosis   . Bilateral swelling of feet   . Thyroid disease   . Diabetes   . Fatigue   . Hypertension   . Bladder problem   . Reflux   . Bruises easily   . Memory loss     Past Surgical History  Procedure Laterality Date  . Cholecystectomy      There were no vitals filed for this visit.  Visit Diagnosis:  Pain in joint, lower leg, left - Plan: PT plan of care cert/re-cert  Generalized weakness - Plan: PT plan of care cert/re-cert  Difficulty walking - Plan: PT plan of care cert/re-cert      Subjective Assessment - 09/13/14 1342    Subjective Patient reports she has had pain in her right hip since May of 2016, which was initially treated with muscle relaxers (which diminished her symptoms, but has not resolved them). She continues to work 3-4 days per week and has no trouble with driving. She reports she has had the most difficulty going up stairs/curbs with her RLE ascending first, sleeping on her side, and sitting in certain positions.    Limitations Sitting;Walking   Diagnostic tests X-Rays    Patient Stated Goals Pain relief.   Currently in Pain? Yes   Pain Score --  In certain positions, at rest no pain.             [facility] PT Assessment - 09/13/14 1200    Assessment   Medical Diagnosis Right hip pain   Onset Date/Surgical Date 06/14/14   Precautions   Precautions None   Restrictions   Weight Bearing Restrictions No   Balance Screen   Has the patient fallen in the past 6 months No   Has the patient had a decrease in activity level because of a fear of falling?  Yes   Prior Function   Level of Independence Independent   Vocation Requirements --  Prolonged walking, occasional stairs   Cognition   Overall Cognitive Status Within Functional Limits for tasks assessed   Observation/Other Assessments   Modified [place] 32%   Sensation   Light Touch Appears Intact   Strength   Right Hip Flexion 4+/5   Right Hip External Rotation  4   Right Hip ABduction 4-/5   Left Hip Flexion 5/5   Left Hip External Rotation  5   Left Hip ABduction 5/5   Right Knee Flexion 5/5   Right Knee Extension 5/5   Left Knee Flexion 5/5   Left Knee Extension 5/5   Right Ankle Dorsiflexion 5/5   Right Ankle Plantar Flexion 5/5   Left Ankle Dorsiflexion 5/5  Left Ankle Plantar Flexion 5/5   Flexibility   Hamstrings --  Mild pain bilaterally on 90-90 on RLE   Quadriceps --  Restricted mobility bilaterally and "pulling" in hip flexors   Piriformis --  Reproductive of her pain on R side   Standardized Balance Assessment   Five times sit to stand comments  13.2 seconds     TherEx Clamshells x 10 on R LE well tolerated with cuing for being on her side  Manual piriformis stretching by PT x 1 minute for 2 repetitions with 1 repetition x 1 minute with cuing by patient for proper technique, significantly decreased her pain with ambulation.  Long axis distraction on RLE grade II-III provided with significant decrease in pain with ambulation afterwards.  Soft tissue mobilization provided to R posterior hip in region of glute medius with significant pain relief with ambulation afterwards.                        PT Education - 09/13/14 1344    Education provided Yes   Education Details Provided patient with HEP and discussed with patient her hip weakness/tightness seemed to be provoking her symptoms and that these would benefit from PT.    Person(s) Educated Patient   Methods Explanation;Demonstration   Comprehension Verbalized understanding;Returned demonstration             PT Long Term Goals - 09/13/14 1757    PT LONG TERM GOAL #1   Title Patient will be independent with a home exercise program to decrease her pain symptoms and increase her LE strength.    Status New   PT LONG TERM GOAL #2   Title Patient will report an ODI score of less than 20% disability to display increased tolerance for functional activities.    Baseline 32%   Status New   PT LONG TERM GOAL #3   Title Patient will ascend and descend at least 4 steps with no increase in pain with reciprocal gait pattern to demonstrate increased tolerance for functional activities.    Baseline Unable to walk up steps without pain in RLE when it ascends first.    Status New   PT LONG TERM GOAL #4   Title Patient will report a worst pain score of no more than 2/10 on VAS scale to demonstrate increased tolerance for functional activities.    Status New               Plan - 09/13/14 1800    Clinical Impression Statement Patient displays restrictions in hip mobility bilaterally, but significant weakness on her RLE in MMT. Patient expressed pain with piriformis stretching initially, but after repeated movements and soft tissue mobilization stated a decrease in symptoms of over 50%. Patient  provided with HEP to address poor neuromuscular recruitment of gluteal region and tightness/stiffness of her RLE to relieve current symptoms. Patient would benefit from skilled PT to address her hip mobility and strength deficits.    Pt will benefit from skilled therapeutic intervention in order to improve  on the following deficits Abnormal gait;Decreased activity tolerance;Pain;Decreased strength;Decreased mobility;Difficulty walking   Rehab Potential Good   Clinical Impairments Affecting Rehab Potential Quick response to therapy today.    PT Frequency 2x / week   PT Duration 6 weeks   PT Treatment/Interventions Gait training;Stair training;Therapeutic activities;Therapeutic exercise;Manual techniques;Cryotherapy;Moist Heat;ADLs/Self Care Home Management   PT Next Visit Plan Progress HEP and manual techniques as indicated.    PT Home  Exercise Plan See patient instructions.    Consulted and Agree with Plan of Care Patient          G-Codes - 09/25/14 1754    Functional Assessment Tool Used ODI, Clinical judgment    Functional Limitation Mobility: Walking and moving around   Mobility: Walking and Moving Around Current Status [phone]) At least 20 percent but less than 40 percent impaired, limited or restricted   Mobility: Walking and Moving Around Goal Status [phone]) At least 1 percent but less than 20 percent impaired, limited or restricted   Mobility: Walking and Moving Around Discharge Status [phone]) --       Problem List There are no active problems to display for this patient.   Kerman Passey, PT, DPT    09/14/2014, 4:27 PM  [place] MAIN [facility] SERVICES [address] [place], [place], [address] Phone: [phone]   Fax:  [phone]

## 2014-09-19 ENCOUNTER — Ambulatory Visit: Payer: Medicare Other | Admitting: Physical Therapy

## 2014-09-19 ENCOUNTER — Encounter: Payer: Self-pay | Admitting: Physical Therapy

## 2014-09-19 DIAGNOSIS — M25562 Pain in left knee: Secondary | ICD-10-CM

## 2014-09-19 DIAGNOSIS — R531 Weakness: Secondary | ICD-10-CM | POA: Diagnosis not present

## 2014-09-19 DIAGNOSIS — R262 Difficulty in walking, not elsewhere classified: Secondary | ICD-10-CM | POA: Diagnosis not present

## 2014-09-19 NOTE — Therapy (Signed)
[place] MAIN [facility] SERVICES [address] [place], [place], [address] Phone: [phone]   Fax:  [phone]  Physical Therapy Treatment  Patient Details  Name: Rebecca Sellers MRN: [id number] Date of Birth: [birth year] Referring Provider:  Shon Baton, MD  Encounter Date: 09/19/2014      PT End of Session - 09/19/14 1004    Visit Number 2   Number of Visits 13   Date for PT Re-Evaluation 11/08/14   Authorization Type 1   Authorization Time Period 10   Activity Tolerance Patient tolerated treatment well   Behavior During Therapy [facility] for tasks assessed/performed      Past Medical History  Diagnosis Date  . Osteoarthritis   . Osteoporosis   . Bilateral swelling of feet   . Thyroid disease   . Diabetes   . Fatigue   . Hypertension   . Bladder problem   . Reflux   . Bruises easily   . Memory loss     Past Surgical History  Procedure Laterality Date  . Cholecystectomy      There were no vitals filed for this visit.  Visit Diagnosis:  Pain in joint, lower leg, left  Generalized weakness  Difficulty walking      Subjective Assessment - 09/19/14 1003    Subjective Patient is continuing to have right buttocks pain 7/10 and is doing her HEP.    Limitations Sitting   Diagnostic tests X-Rays    Patient Stated Goals Pain relief.   Currently in Pain? Yes   Pain Score 7           Patient seen for manual therapy including long axis stretch to RLE with 30 bouts and 8 minutes x 2 repetitions Piriformis stretching x 30 sec x 6 bouts Ice x 10 minutes.  Reviewed HEP  Patient has pain decreased to 5/10 pain.                       PT Education - 09/19/14 1004    Education provided Yes   Person(s) Educated Patient   Methods Explanation   Comprehension Verbalized understanding             PT Long Term Goals - 09/13/14 1757    PT LONG TERM GOAL #1   Title Patient will be independent with a home  exercise program to decrease her pain symptoms and increase her LE strength.    Status New   PT LONG TERM GOAL #2   Title Patient will report an ODI score of less than 20% disability to display increased tolerance for functional activities.    Baseline 32%   Status New   PT LONG TERM GOAL #3   Title Patient will ascend and descend at least 4 steps with no increase in pain with reciprocal gait pattern to demonstrate increased tolerance for functional activities.    Baseline Unable to walk up steps without pain in RLE when it ascends first.    Status New   PT LONG TERM GOAL #4   Title Patient will report a worst pain score of no more than 2/10 on VAS scale to demonstrate increased tolerance for functional activities.    Status New               Plan - 09/19/14 1004    Clinical Impression Statement Patient continues to have right glut pain and is able to decrease her pain with piriformis stretching and long  axis distraction.    Pt will benefit from skilled therapeutic intervention in order to improve on the following deficits Abnormal gait;Decreased activity tolerance;Pain;Decreased strength;Decreased mobility;Difficulty walking   Rehab Potential Good   Clinical Impairments Affecting Rehab Potential Quick response to therapy today.    PT Frequency 2x / week   PT Duration 6 weeks   PT Treatment/Interventions Gait training;Stair training;Therapeutic activities;Therapeutic exercise;Manual techniques;Cryotherapy;Moist Heat;ADLs/Self Care Home Management   PT Next Visit Plan Progress HEP and manual techniques as indicated.    PT Home Exercise Plan See patient instructions.    Consulted and Agree with Plan of Care Patient        Problem List There are no active problems to display for this patient.   Alanson Puls 09/19/2014, 10:07 AM  [place] MAIN [facility] SERVICES [address] [place], [place], [address] Phone: [phone]   Fax:   [phone]

## 2014-09-26 ENCOUNTER — Ambulatory Visit: Payer: Medicare Other

## 2014-09-26 DIAGNOSIS — M25562 Pain in left knee: Secondary | ICD-10-CM | POA: Diagnosis not present

## 2014-09-26 DIAGNOSIS — R531 Weakness: Secondary | ICD-10-CM | POA: Diagnosis not present

## 2014-09-26 DIAGNOSIS — R262 Difficulty in walking, not elsewhere classified: Secondary | ICD-10-CM

## 2014-09-26 NOTE — Patient Instructions (Addendum)
All exercises provided were adapted from [email or web address].com. Patient was provided a written handout with pictures as described. Any additional cues were manually entered in to handout and copied in to this document.  SIT TO STAND ** USE YOUR HANDS Start by sitting in a chair. Next, raise up to standing without using your hands for support.    Single Leg Deadlift  Standing on left leg, bring right leg to 90 degrees in front of you.  Next, extend right leg behind you until you reach a lunge position keeping weight on left leg.  Keep left leg in straight forward alignment.  Repeat sets on both legs.   SUPINE HIP ROTATION  While lying on back on a firm flat surface, place arms down to sides as pictured; bend both knees so feet are flat on table (Picture 1).  Rotate both knees to the right while keeping both shoulder blades flat on the table.

## 2014-09-26 NOTE — Therapy (Signed)
[place] MAIN [facility] SERVICES [address] [place], [place], [address] Phone: [phone]   Fax:  [phone]  Physical Therapy Treatment  Patient Details  Name: Rebecca Sellers MRN: [id number] Date of Birth: October 29, 1930 Referring Provider:  Shon Baton, MD  Encounter Date: 09/26/2014      PT End of Session - 09/26/14 1127    Visit Number 3   Number of Visits 13   Date for PT Re-Evaluation 11/08/14   Authorization Type 3   Authorization Time Period 10   PT Start Time 1030   PT Stop Time 1113   PT Time Calculation (min) 43 min   Activity Tolerance Patient tolerated treatment well   Behavior During Therapy [facility] for tasks assessed/performed      Past Medical History  Diagnosis Date  . Osteoarthritis   . Osteoporosis   . Bilateral swelling of feet   . Thyroid disease   . Diabetes   . Fatigue   . Hypertension   . Bladder problem   . Reflux   . Bruises easily   . Memory loss     Past Surgical History  Procedure Laterality Date  . Cholecystectomy      There were no vitals filed for this visit.  Visit Diagnosis:  Pain in joint, lower leg, left  Generalized weakness  Difficulty walking      Subjective Assessment - 09/26/14 1148    Subjective Patient reports that she has not had much lasting improvement with rotations and ascending/descending steps. She conintinues to have minimal symptoms during static activities, and really only experiences symptoms with turning to get out of bed and closed chain knee and hip extension activities.    Limitations Sitting;Walking   Diagnostic tests X-Rays    Patient Stated Goals Pain relief.   Currently in Pain? Yes   Pain Score 5    Pain Location Hip   Pain Orientation Right   Pain Descriptors / Indicators Aching   Pain Type Chronic pain   Pain Onset More than a month ago   Pain Frequency Intermittent   Aggravating Factors  Turning from side to side, ascending steps/curbs.    Pain  Relieving Factors Anything aside from turning side to side and ascending steps/curbs.           Long axis distraction 3 bouts throughout session grade II-III with marked decrease in symptoms.   Supine bridging x 10  Single leg "deadlifts" with bilateral HHA 2 sets x 10 repetitions   PNF stretching with side rotations in supine x 3 repetitions for ~1 minute with increased AROM in side rotations and decreased symptoms afterwards (Contract relax into ER in supine against manual resistance from PT)  Sit to stand training 3 sets x 10 repetitions 1st 1 with no hands, significant valgus and trunk flexion. 2nd and 3rd sets x 10 with use of UEs   Ascending and descending steps with bilateral hand rails and cuing for upright posture and heel on the step x 4 for 2 sets with no increase in symptoms                         PT Education - 09/26/14 1121    Education provided Yes   Education Details Provided patient with updated HEP and that strengthening program will assist in her pain relief as demonstrated during today's session.    Person(s) Educated Patient   Methods Explanation;Demonstration;Handout   Comprehension Verbalized understanding;Returned  demonstration             PT Long Term Goals - 09/13/14 1757    PT LONG TERM GOAL #1   Title Patient will be independent with a home exercise program to decrease her pain symptoms and increase her LE strength.    Status New   PT LONG TERM GOAL #2   Title Patient will report an ODI score of less than 20% disability to display increased tolerance for functional activities.    Baseline 32%   Status New   PT LONG TERM GOAL #3   Title Patient will ascend and descend at least 4 steps with no increase in pain with reciprocal gait pattern to demonstrate increased tolerance for functional activities.    Baseline Unable to walk up steps without pain in RLE when it ascends first.    Status New   PT LONG TERM GOAL #4   Title  Patient will report a worst pain score of no more than 2/10 on VAS scale to demonstrate increased tolerance for functional activities.    Status New               Plan - 09/26/14 1128    Clinical Impression Statement Patient demonstrates decreased symptoms after PNF techniques, and gluteal targeting exercises. Patient initially presented with pain on trunk and LE rotations in supine, reproductive of her chief complaints. After treatment session she could complete these with a "tugging" sensation and no pain. Additionally she was able to ascend and descend steps without pain after treatment session. It appears she has residual pain in her posterolateral hip musculature after attempting to lose weight in the spring by increasing how many steps she was going up, and is experiencing a sensitization/stiffness response from the CNS. She reports less symptoms after weight bearing activities addressing these.    Pt will benefit from skilled therapeutic intervention in order to improve on the following deficits Abnormal gait;Decreased activity tolerance;Pain;Decreased strength;Decreased mobility;Difficulty walking   Rehab Potential Good   Clinical Impairments Affecting Rehab Potential Continues to have good responses to activity during session.    PT Frequency 2x / week   PT Duration 6 weeks   PT Treatment/Interventions Gait training;Stair training;Therapeutic activities;Therapeutic exercise;Manual techniques;Cryotherapy;Moist Heat;ADLs/Self Care Home Management   PT Next Visit Plan Progress HEP and manual techniques as indicated. Hamstring stretching?   PT Home Exercise Plan See patient instructions.    Consulted and Agree with Plan of Care Patient        Problem List There are no active problems to display for this patient.  Kerman Passey, PT, DPT    09/26/2014, 12:56 PM  [place] MAIN [facility] SERVICES [address] [place], [place],  [address] Phone: [phone]   Fax:  [phone]

## 2014-10-03 ENCOUNTER — Encounter: Payer: Self-pay | Admitting: Physical Therapy

## 2014-10-03 ENCOUNTER — Ambulatory Visit: Payer: Medicare Other | Admitting: Physical Therapy

## 2014-10-03 DIAGNOSIS — R262 Difficulty in walking, not elsewhere classified: Secondary | ICD-10-CM

## 2014-10-03 DIAGNOSIS — M25562 Pain in left knee: Secondary | ICD-10-CM

## 2014-10-03 DIAGNOSIS — R531 Weakness: Secondary | ICD-10-CM | POA: Diagnosis not present

## 2014-10-03 NOTE — Therapy (Signed)
[employer] MAIN [facility] SERVICES [address] [place], [place], [address] Phone: [phone]   Fax:  [phone]  Physical Therapy Treatment  Patient Details  Name: Rebecca Sellers MRN: [id number] Date of Birth: Jun 05, 1930 Referring Provider:  Shon Baton, MD  Encounter Date: 10/03/2014      PT End of Session - 10/03/14 1203    Visit Number 4   Number of Visits 13   Date for PT Re-Evaluation 11/08/14   Authorization Type 3   Authorization Time Period 10   Activity Tolerance Patient tolerated treatment well   Behavior During Therapy [facility] for tasks assessed/performed      Past Medical History  Diagnosis Date  . Osteoarthritis   . Osteoporosis   . Bilateral swelling of feet   . Thyroid disease   . Diabetes   . Fatigue   . Hypertension   . Bladder problem   . Reflux   . Bruises easily   . Memory loss     Past Surgical History  Procedure Laterality Date  . Cholecystectomy      There were no vitals filed for this visit.  Visit Diagnosis:  Pain in joint, lower leg, left  Generalized weakness  Difficulty walking      Subjective Assessment - 10/03/14 1157    Subjective Patient is having 4/10 pain in left leg, and it goes into her buttocks and middle of her thigh.   Limitations Sitting;Walking   Diagnostic tests X-Rays    Patient Stated Goals Pain relief.   Pain Onset More than a month ago        Manula therapy: Long axis distraction RLE and piriformis stretch x 30 x 3  Therapeutic exericse;  Hooklying marching, hep abd/ER Prone extension 10 x 2  Pain decreased from 4/10 to 3/10 today.                         PT Education - 10/03/14 1158    Education provided Yes   Person(s) Educated Patient   Methods Explanation   Comprehension Verbalized understanding             PT Long Term Goals - 09/13/14 1757    PT LONG TERM GOAL #1   Title Patient will be independent with a home exercise  program to decrease her pain symptoms and increase her LE strength.    Status New   PT LONG TERM GOAL #2   Title Patient will report an ODI score of less than 20% disability to display increased tolerance for functional activities.    Baseline 32%   Status New   PT LONG TERM GOAL #3   Title Patient will ascend and descend at least 4 steps with no increase in pain with reciprocal gait pattern to demonstrate increased tolerance for functional activities.    Baseline Unable to walk up steps without pain in RLE when it ascends first.    Status New   PT LONG TERM GOAL #4   Title Patient will report a worst pain score of no more than 2/10 on VAS scale to demonstrate increased tolerance for functional activities.    Status New               Plan - 10/03/14 1203    Clinical Impression Statement Patient was able to get relief after loong axis distraction to RLE x 3 repetitions and pififormis stretch with good results.    Pt will benefit  from skilled therapeutic intervention in order to improve on the following deficits Abnormal gait;Decreased activity tolerance;Pain;Decreased strength;Decreased mobility;Difficulty walking   Rehab Potential Good   Clinical Impairments Affecting Rehab Potential Continues to have good responses to activity during session.    PT Frequency 2x / week   PT Duration 6 weeks   PT Treatment/Interventions Gait training;Stair training;Therapeutic activities;Therapeutic exercise;Manual techniques;Cryotherapy;Moist Heat;ADLs/Self Care Home Management   PT Next Visit Plan Progress HEP and manual techniques as indicated. Hamstring stretching?   PT Home Exercise Plan See patient instructions.    Consulted and Agree with Plan of Care Patient        Problem List There are no active problems to display for this patient.   Alanson Puls 10/03/2014, 12:14 PM  [place] MAIN [facility] SERVICES [address] [place],  [place], [address] Phone: [phone]   Fax:  [phone]

## 2014-10-04 DIAGNOSIS — I10 Essential (primary) hypertension: Secondary | ICD-10-CM | POA: Diagnosis not present

## 2014-10-04 DIAGNOSIS — N390 Urinary tract infection, site not specified: Secondary | ICD-10-CM | POA: Diagnosis not present

## 2014-10-04 DIAGNOSIS — E119 Type 2 diabetes mellitus without complications: Secondary | ICD-10-CM | POA: Diagnosis not present

## 2014-10-04 DIAGNOSIS — Z0000 Encounter for general adult medical examination without abnormal findings: Secondary | ICD-10-CM | POA: Diagnosis not present

## 2014-10-04 DIAGNOSIS — E559 Vitamin D deficiency, unspecified: Secondary | ICD-10-CM | POA: Diagnosis not present

## 2014-10-04 DIAGNOSIS — E039 Hypothyroidism, unspecified: Secondary | ICD-10-CM | POA: Diagnosis not present

## 2014-10-04 DIAGNOSIS — M109 Gout, unspecified: Secondary | ICD-10-CM | POA: Diagnosis not present

## 2014-10-11 ENCOUNTER — Ambulatory Visit: Payer: Medicare Other | Attending: Internal Medicine

## 2014-10-11 DIAGNOSIS — R209 Unspecified disturbances of skin sensation: Secondary | ICD-10-CM | POA: Diagnosis not present

## 2014-10-11 DIAGNOSIS — Z23 Encounter for immunization: Secondary | ICD-10-CM | POA: Diagnosis not present

## 2014-10-11 DIAGNOSIS — M5430 Sciatica, unspecified side: Secondary | ICD-10-CM | POA: Diagnosis not present

## 2014-10-11 DIAGNOSIS — M25562 Pain in left knee: Secondary | ICD-10-CM | POA: Insufficient documentation

## 2014-10-11 DIAGNOSIS — Z6826 Body mass index (BMI) 26.0-26.9, adult: Secondary | ICD-10-CM | POA: Diagnosis not present

## 2014-10-11 DIAGNOSIS — Z0000 Encounter for general adult medical examination without abnormal findings: Secondary | ICD-10-CM | POA: Diagnosis not present

## 2014-10-11 DIAGNOSIS — R262 Difficulty in walking, not elsewhere classified: Secondary | ICD-10-CM | POA: Insufficient documentation

## 2014-10-11 DIAGNOSIS — E785 Hyperlipidemia, unspecified: Secondary | ICD-10-CM | POA: Diagnosis not present

## 2014-10-11 DIAGNOSIS — M1990 Unspecified osteoarthritis, unspecified site: Secondary | ICD-10-CM | POA: Diagnosis not present

## 2014-10-11 DIAGNOSIS — R531 Weakness: Secondary | ICD-10-CM | POA: Diagnosis not present

## 2014-10-11 DIAGNOSIS — M109 Gout, unspecified: Secondary | ICD-10-CM | POA: Diagnosis not present

## 2014-10-11 DIAGNOSIS — D692 Other nonthrombocytopenic purpura: Secondary | ICD-10-CM | POA: Diagnosis not present

## 2014-10-11 DIAGNOSIS — N184 Chronic kidney disease, stage 4 (severe): Secondary | ICD-10-CM | POA: Diagnosis not present

## 2014-10-11 DIAGNOSIS — I8390 Asymptomatic varicose veins of unspecified lower extremity: Secondary | ICD-10-CM | POA: Diagnosis not present

## 2014-10-11 DIAGNOSIS — R413 Other amnesia: Secondary | ICD-10-CM | POA: Diagnosis not present

## 2014-10-11 NOTE — Patient Instructions (Signed)
[email or web address] Bridging 2x10 Pavlov press 2x10 with red band

## 2014-10-11 NOTE — Therapy (Signed)
[place] MAIN [facility] SERVICES [address] [place], [place], [address] Phone: [phone]   Fax:  [phone]  Physical Therapy Treatment  Patient Details  Name: Rebecca Sellers MRN: [id number] Date of Birth: [birth year] Referring Provider:  Shon Baton, MD  Encounter Date: 10/11/2014      PT End of Session - 10/11/14 1546    Visit Number 5   Number of Visits 13   Date for PT Re-Evaluation 11/08/14   Authorization Type 5   Authorization Time Period 10   PT Start Time 1030   PT Stop Time 1115   PT Time Calculation (min) 45 min   Activity Tolerance Patient tolerated treatment well   Behavior During Therapy [facility] for tasks assessed/performed      Past Medical History  Diagnosis Date  . Osteoarthritis   . Osteoporosis   . Bilateral swelling of feet   . Thyroid disease   . Diabetes   . Fatigue   . Hypertension   . Bladder problem   . Reflux   . Bruises easily   . Memory loss     Past Surgical History  Procedure Laterality Date  . Cholecystectomy      There were no vitals filed for this visit.  Visit Diagnosis:  Pain in joint, lower leg, left  Generalized weakness  Difficulty walking      Subjective Assessment - 10/11/14 1544    Subjective Pt relates she is doing well and having her legs "pulled" helps the most out of all the interventions.  Pt reports she is seeing her MD for a yearly physical and will inform him/her about her progress.  Pt reports her right hip pain is at a 2.5 and it feels like a dull ache.  Pt reports her worst pain is in the morning when getting out of bed and improves throughout the day.     Limitations Sitting;Walking   Diagnostic tests X-Rays    Patient Stated Goals Pain relief.   Currently in Pain? Yes   Pain Score 3    Pain Location Hip   Pain Orientation Right   Pain Descriptors / Indicators Aching;Dull   Pain Onset More than a month ago          Manual therapy: Long axis distraction  RLE x 6 min with 15 sec on and 10 sec off with grade 3 Right piriformis stretch 3x30 sec  Therapeutic exercise: Bridge 2x10 Side stepping with red band above knees x 5 laps in //bars Back wards walking with red band above knees x 5 laps in //bars Pavlov press with red band 2x10 5x sit to stand: 9.93 sec Pt required min verbal and visual cues for correct exercise technique  Pain decreased from 3/10 to 0/10 today.                        PT Education - 10/11/14 1545    Education provided Yes   Education Details HEP progression    Person(s) Educated Patient   Methods Explanation;Demonstration   Comprehension Verbalized understanding;Returned demonstration             PT Long Term Goals - 09/13/14 1757    PT LONG TERM GOAL #1   Title Patient will be independent with a home exercise program to decrease her pain symptoms and increase her LE strength.    Status New   PT LONG TERM GOAL #2   Title Patient will report  an ODI score of less than 20% disability to display increased tolerance for functional activities.    Baseline 32%   Status New   PT LONG TERM GOAL #3   Title Patient will ascend and descend at least 4 steps with no increase in pain with reciprocal gait pattern to demonstrate increased tolerance for functional activities.    Baseline Unable to walk up steps without pain in RLE when it ascends first.    Status New   PT LONG TERM GOAL #4   Title Patient will report a worst pain score of no more than 2/10 on VAS scale to demonstrate increased tolerance for functional activities.    Status New               Plan - 10/11/14 1551    Clinical Impression Statement pt responded well to there ex progression today, with focus on core and hip strengthening.  pt reported a decrease in pain at the end of the session.  Pt reports she has made significant improvements with PT and her symptoms are relieved after a session with symptoms returning ~ one day  after treatment.     Pt will benefit from skilled therapeutic intervention in order to improve on the following deficits Abnormal gait;Decreased activity tolerance;Pain;Decreased strength;Decreased mobility;Difficulty walking   Rehab Potential Good   Clinical Impairments Affecting Rehab Potential Continues to have good responses to activity during session.    PT Frequency 2x / week   PT Duration 6 weeks   PT Treatment/Interventions Gait training;Stair training;Therapeutic activities;Therapeutic exercise;Manual techniques;Cryotherapy;Moist Heat;ADLs/Self Care Home Management   Consulted and Agree with Plan of Care Patient        Problem List There are no active problems to display for this patient.  Renford Dills, SPT This entire session was performed under direct supervision and direction of a licensed Chiropractor . I have personally read, edited and approve of the note as written. Gorden Harms. Tortorici, PT, DPT [phone]  Tortorici,Ashley 10/11/2014, 4:01 PM  [place] MAIN [facility] SERVICES [address] [place], [place], [address] Phone: [phone]   Fax:  [phone]

## 2014-10-17 DIAGNOSIS — Z1212 Encounter for screening for malignant neoplasm of rectum: Secondary | ICD-10-CM | POA: Diagnosis not present

## 2014-10-24 ENCOUNTER — Ambulatory Visit: Payer: Medicare Other | Admitting: Physical Therapy

## 2014-10-24 ENCOUNTER — Encounter: Payer: Self-pay | Admitting: Physical Therapy

## 2014-10-24 DIAGNOSIS — R531 Weakness: Secondary | ICD-10-CM

## 2014-10-24 DIAGNOSIS — R262 Difficulty in walking, not elsewhere classified: Secondary | ICD-10-CM | POA: Diagnosis not present

## 2014-10-24 DIAGNOSIS — M25562 Pain in left knee: Secondary | ICD-10-CM | POA: Diagnosis not present

## 2014-10-24 NOTE — Therapy (Signed)
[place] MAIN [facility] SERVICES [address] [place], [place], [address] Phone: [phone]   Fax:  [phone]  Physical Therapy Treatment  Patient Details  Name: Rebecca Sellers MRN: [id number] Date of Birth: [birth year] Referring Provider:  Shon Baton, MD  Encounter Date: 10/24/2014      PT End of Session - 10/24/14 1215    Visit Number 6   Number of Visits 13   Date for PT Re-Evaluation 11/08/14   Authorization Type 6   Authorization Time Period 10   PT Start Time 1145   PT Stop Time 1230   PT Time Calculation (min) 45 min   Equipment Utilized During Treatment Gait belt   Activity Tolerance Patient tolerated treatment well   Behavior During Therapy [facility] for tasks assessed/performed      Past Medical History  Diagnosis Date  . Osteoarthritis   . Osteoporosis   . Bilateral swelling of feet   . Thyroid disease   . Diabetes   . Fatigue   . Hypertension   . Bladder problem   . Reflux   . Bruises easily   . Memory loss     Past Surgical History  Procedure Laterality Date  . Cholecystectomy      There were no vitals filed for this visit.  Visit Diagnosis:  Pain in joint, lower leg, left  Generalized weakness      Subjective Assessment - 10/24/14 1135    Subjective Patient says that she is getting better and has 3 or 3 1/2 /10 pain to her left buttocks.    Limitations Sitting;Walking   Diagnostic tests X-Rays    Patient Stated Goals Pain relief.   Currently in Pain? Yes   Pain Score 3    Pain Location Buttocks   Pain Onset More than a month ago        Patient seen for manual therapy including long axis stretch to RLE with 30 bouts and 8 minutes x 2 repetitions Piriformis stretching x 30 sec x 6 bouts Patient has 0/10 pain following manual therapy and no antalgic gait.                           PT Education - 10/24/14 1214    Education provided Yes   Person(s) Educated Patient   Methods  Explanation   Comprehension Verbalized understanding             PT Long Term Goals - 09/13/14 1757    PT LONG TERM GOAL #1   Title Patient will be independent with a home exercise program to decrease her pain symptoms and increase her LE strength.    Status New   PT LONG TERM GOAL #2   Title Patient will report an ODI score of less than 20% disability to display increased tolerance for functional activities.    Baseline 32%   Status New   PT LONG TERM GOAL #3   Title Patient will ascend and descend at least 4 steps with no increase in pain with reciprocal gait pattern to demonstrate increased tolerance for functional activities.    Baseline Unable to walk up steps without pain in RLE when it ascends first.    Status New   PT LONG TERM GOAL #4   Title Patient will report a worst pain score of no more than 2/10 on VAS scale to demonstrate increased tolerance for functional activities.    Status New  Plan - 10/24/14 1216    Clinical Impression Statement Patient has progressed today to 0/10 pain following manual therapy.    Pt will benefit from skilled therapeutic intervention in order to improve on the following deficits Abnormal gait;Decreased activity tolerance;Pain;Decreased strength;Decreased mobility;Difficulty walking   Rehab Potential Good   Clinical Impairments Affecting Rehab Potential Continues to have good responses to activity during session.    PT Frequency 2x / week   PT Duration 6 weeks   PT Treatment/Interventions Gait training;Stair training;Therapeutic activities;Therapeutic exercise;Manual techniques;Cryotherapy;Moist Heat;ADLs/Self Care Home Management   Consulted and Agree with Plan of Care Patient        Problem List There are no active problems to display for this patient.   Alanson Puls 10/24/2014, 12:19 PM  [place] MAIN [facility] SERVICES [address] [place], [place],  [address] Phone: [phone]   Fax:  [phone]

## 2014-10-31 ENCOUNTER — Encounter: Payer: Self-pay | Admitting: Physical Therapy

## 2014-10-31 ENCOUNTER — Ambulatory Visit: Payer: Medicare Other | Admitting: Physical Therapy

## 2014-10-31 DIAGNOSIS — M25562 Pain in left knee: Secondary | ICD-10-CM | POA: Diagnosis not present

## 2014-10-31 DIAGNOSIS — R531 Weakness: Secondary | ICD-10-CM

## 2014-10-31 DIAGNOSIS — R262 Difficulty in walking, not elsewhere classified: Secondary | ICD-10-CM | POA: Diagnosis not present

## 2014-10-31 NOTE — Therapy (Signed)
[place] MAIN [facility] SERVICES [address] [place], [place], [address] Phone: [phone]   Fax:  [phone]  Physical Therapy Treatment  Patient Details  Name: Rebecca Sellers MRN: [id number] Date of Birth: 1930-07-21 Referring Provider:  Shon Baton, MD  Encounter Date: 10/31/2014      PT End of Session - 10/31/14 1258    Visit Number 7   Number of Visits 13   Date for PT Re-Evaluation 11/08/14   Authorization Type 7   Authorization Time Period 10   PT Start Time 1145   PT Stop Time 1215   PT Time Calculation (min) 30 min   Equipment Utilized During Treatment Gait belt   Activity Tolerance Patient tolerated treatment well   Behavior During Therapy [facility] for tasks assessed/performed      Past Medical History  Diagnosis Date  . Osteoarthritis   . Osteoporosis   . Bilateral swelling of feet   . Thyroid disease   . Diabetes   . Fatigue   . Hypertension   . Bladder problem   . Reflux   . Bruises easily   . Memory loss     Past Surgical History  Procedure Laterality Date  . Cholecystectomy      There were no vitals filed for this visit.  Visit Diagnosis:  Pain in joint, lower leg, left  Generalized weakness  Difficulty walking      Subjective Assessment - 10/31/14 1257    Subjective Patient has 3/10 pain to right  hip and leg.    Limitations Sitting   Patient Stated Goals Pain relief.   Pain Score 3          Patient seen for manual therapy including long axis stretch to RLE with 30 bouts and 8 minutes x 2 repetitions Piriformis stretching x 30 sec x 6 bouts Patient has 0/10 pain following manual therapy and no antalgic gait.                              PT Long Term Goals - 09/13/14 1757    PT LONG TERM GOAL #1   Title Patient will be independent with a home exercise program to decrease her pain symptoms and increase her LE strength.    Status New   PT LONG TERM GOAL #2   Title  Patient will report an ODI score of less than 20% disability to display increased tolerance for functional activities.    Baseline 32%   Status New   PT LONG TERM GOAL #3   Title Patient will ascend and descend at least 4 steps with no increase in pain with reciprocal gait pattern to demonstrate increased tolerance for functional activities.    Baseline Unable to walk up steps without pain in RLE when it ascends first.    Status New   PT LONG TERM GOAL #4   Title Patient will report a worst pain score of no more than 2/10 on VAS scale to demonstrate increased tolerance for functional activities.    Status New               Plan - 10/31/14 1258    Clinical Impression Statement Patient has 0/10 pain after manual therapy.    Pt will benefit from skilled therapeutic intervention in order to improve on the following deficits Abnormal gait;Decreased activity tolerance;Pain;Decreased strength;Decreased mobility;Difficulty walking   Rehab Potential Good   Clinical Impairments Affecting  Rehab Potential Continues to have good responses to activity during session.    PT Frequency 2x / week   PT Duration 6 weeks   PT Treatment/Interventions Gait training;Stair training;Therapeutic activities;Therapeutic exercise;Manual techniques;Cryotherapy;Moist Heat;ADLs/Self Care Home Management   Consulted and Agree with Plan of Care Patient        Problem List There are no active problems to display for this patient.   Arelia Sneddon S 10/31/2014, 1:13 PM  [place] MAIN [facility] SERVICES [address] [place], [place], [address] Phone: [phone]   Fax:  [phone]

## 2014-10-31 NOTE — Patient Instructions (Signed)
Patient seen for manual therapy including long axis stretch to RLE with 30 bouts and 8 minutes x 2 repetitions Piriformis stretching x 30 sec x 6 bouts Patient has 0/10 pain following manual therapy and no antalgic gait.

## 2014-11-07 ENCOUNTER — Encounter: Payer: PRIVATE HEALTH INSURANCE | Admitting: Physical Therapy

## 2014-11-08 DIAGNOSIS — E785 Hyperlipidemia, unspecified: Secondary | ICD-10-CM | POA: Diagnosis not present

## 2014-11-08 DIAGNOSIS — M810 Age-related osteoporosis without current pathological fracture: Secondary | ICD-10-CM | POA: Diagnosis not present

## 2014-11-08 DIAGNOSIS — Z6825 Body mass index (BMI) 25.0-25.9, adult: Secondary | ICD-10-CM | POA: Diagnosis not present

## 2014-11-14 ENCOUNTER — Encounter: Payer: PRIVATE HEALTH INSURANCE | Admitting: Physical Therapy

## 2014-11-14 ENCOUNTER — Other Ambulatory Visit (HOSPITAL_COMMUNITY): Payer: Self-pay | Admitting: *Deleted

## 2014-11-15 ENCOUNTER — Encounter (HOSPITAL_COMMUNITY)
Admission: RE | Admit: 2014-11-15 | Discharge: 2014-11-15 | Disposition: A | Discharge status: Home / Self Care | Payer: Medicare Other | Source: Ambulatory Visit | Attending: Internal Medicine | Admitting: Internal Medicine

## 2014-11-15 DIAGNOSIS — M810 Age-related osteoporosis without current pathological fracture: Secondary | ICD-10-CM | POA: Insufficient documentation

## 2014-11-15 MED ORDER — DENOSUMAB 60 MG/ML ~~LOC~~ SOLN
60.0000 mg | Freq: Once | SUBCUTANEOUS | Status: AC
  Administered 2014-11-15: 60 mg via SUBCUTANEOUS
  Filled 2014-11-15: qty 1

## 2014-11-18 ENCOUNTER — Encounter (HOSPITAL_COMMUNITY): Payer: PRIVATE HEALTH INSURANCE

## 2015-01-03 DIAGNOSIS — R209 Unspecified disturbances of skin sensation: Secondary | ICD-10-CM | POA: Diagnosis not present

## 2015-01-03 DIAGNOSIS — I10 Essential (primary) hypertension: Secondary | ICD-10-CM | POA: Diagnosis not present

## 2015-01-03 DIAGNOSIS — R600 Localized edema: Secondary | ICD-10-CM | POA: Diagnosis not present

## 2015-01-03 DIAGNOSIS — Z6825 Body mass index (BMI) 25.0-25.9, adult: Secondary | ICD-10-CM | POA: Diagnosis not present

## 2015-02-10 DIAGNOSIS — E038 Other specified hypothyroidism: Secondary | ICD-10-CM | POA: Diagnosis not present

## 2015-02-10 DIAGNOSIS — R209 Unspecified disturbances of skin sensation: Secondary | ICD-10-CM | POA: Diagnosis not present

## 2015-02-10 DIAGNOSIS — M109 Gout, unspecified: Secondary | ICD-10-CM | POA: Diagnosis not present

## 2015-02-10 DIAGNOSIS — E1122 Type 2 diabetes mellitus with diabetic chronic kidney disease: Secondary | ICD-10-CM | POA: Diagnosis not present

## 2015-02-10 DIAGNOSIS — N184 Chronic kidney disease, stage 4 (severe): Secondary | ICD-10-CM | POA: Diagnosis not present

## 2015-02-10 DIAGNOSIS — E559 Vitamin D deficiency, unspecified: Secondary | ICD-10-CM | POA: Diagnosis not present

## 2015-02-10 DIAGNOSIS — E784 Other hyperlipidemia: Secondary | ICD-10-CM | POA: Diagnosis not present

## 2015-02-10 DIAGNOSIS — I129 Hypertensive chronic kidney disease with stage 1 through stage 4 chronic kidney disease, or unspecified chronic kidney disease: Secondary | ICD-10-CM | POA: Diagnosis not present

## 2015-02-20 ENCOUNTER — Encounter: Payer: Self-pay | Admitting: Neurology

## 2015-02-20 ENCOUNTER — Ambulatory Visit (INDEPENDENT_AMBULATORY_CARE_PROVIDER_SITE_OTHER): Payer: Medicare Other | Admitting: Neurology

## 2015-02-20 ENCOUNTER — Ambulatory Visit (INDEPENDENT_AMBULATORY_CARE_PROVIDER_SITE_OTHER): Payer: Self-pay | Admitting: Neurology

## 2015-02-20 DIAGNOSIS — G5601 Carpal tunnel syndrome, right upper limb: Secondary | ICD-10-CM | POA: Diagnosis not present

## 2015-02-20 DIAGNOSIS — G5603 Carpal tunnel syndrome, bilateral upper limbs: Secondary | ICD-10-CM

## 2015-02-20 DIAGNOSIS — G5602 Carpal tunnel syndrome, left upper limb: Secondary | ICD-10-CM | POA: Diagnosis not present

## 2015-02-20 DIAGNOSIS — G5600 Carpal tunnel syndrome, unspecified upper limb: Secondary | ICD-10-CM

## 2015-02-20 HISTORY — DX: Carpal tunnel syndrome, unspecified upper limb: G56.00

## 2015-02-20 NOTE — Progress Notes (Signed)
Please refer to EMG and nerve conduction study procedure note. 

## 2015-02-20 NOTE — Procedures (Signed)
     HISTORY:  Rebecca Sellers is an 82 year old patient with a history of diabetes who reports a several year history of problems with numbness in the hands, much more prevalent on the left than the right. The patient will have occasional symptoms on the right, more persistent on the left. She reports some numbness up to the mid upper arm level. The patient is being evaluated for possible neuropathy or a cervical radiculopathy. She denies any significant neck discomfort.  NERVE CONDUCTION STUDIES:  Nerve conduction studies were performed on both upper extremities. The distal motor latencies for the median nerves were prolonged bilaterally with normal motor amplitudes for these nerves bilaterally. The distal motor latencies and motor amplitudes for the ulnar nerves were normal bilaterally. The F wave latencies and nerve conduction velocities for the median and ulnar nerves were normal bilaterally. The sensory latencies for the median nerves were prolonged bilaterally, normal for the ulnar nerves bilaterally.  EMG STUDIES:  EMG study was performed on the left upper extremity:  The first dorsal interosseous muscle reveals 2 to 4 K units with full recruitment. No fibrillations or positive waves were noted. The abductor pollicis brevis muscle reveals 2 to 4 K units with decreased recruitment. No fibrillations or positive waves were noted. The extensor indicis proprius muscle reveals 1 to 3 K units with full recruitment. No fibrillations or positive waves were noted. The pronator teres muscle reveals 2 to 3 K units with full recruitment. No fibrillations or positive waves were noted. The biceps muscle reveals 1 to 2 K units with full recruitment. No fibrillations or positive waves were noted. The triceps muscle reveals 1 to 3 K units with full recruitment. No fibrillations or positive waves were noted. The anterior deltoid muscle reveals 2 to 3 K units with full recruitment. No fibrillations or  positive waves were noted. The cervical paraspinal muscles were tested at 2 levels. No abnormalities of insertional activity were seen at either level tested. There was good relaxation.   IMPRESSION:  Nerve conduction studies done on both upper extremities reveals evidence of bilateral carpal tunnel syndrome of mild severity. EMG evaluation of the left upper extremity shows mild chronic changes in the APB muscle consistent with carpal tunnel syndrome, no evidence of an overlying cervical radiculopathy is seen.  Jill Alexanders MD 02/20/2015 4:40 PM  [facility] [address] [place], [place] [address]  Phone [phone] Fax [phone]

## 2015-02-21 DIAGNOSIS — Z6825 Body mass index (BMI) 25.0-25.9, adult: Secondary | ICD-10-CM | POA: Diagnosis not present

## 2015-02-21 DIAGNOSIS — Z1389 Encounter for screening for other disorder: Secondary | ICD-10-CM | POA: Diagnosis not present

## 2015-02-21 DIAGNOSIS — E119 Type 2 diabetes mellitus without complications: Secondary | ICD-10-CM | POA: Diagnosis not present

## 2015-02-21 DIAGNOSIS — R413 Other amnesia: Secondary | ICD-10-CM | POA: Diagnosis not present

## 2015-02-21 DIAGNOSIS — I10 Essential (primary) hypertension: Secondary | ICD-10-CM | POA: Diagnosis not present

## 2015-02-21 DIAGNOSIS — N184 Chronic kidney disease, stage 4 (severe): Secondary | ICD-10-CM | POA: Diagnosis not present

## 2015-02-21 DIAGNOSIS — I129 Hypertensive chronic kidney disease with stage 1 through stage 4 chronic kidney disease, or unspecified chronic kidney disease: Secondary | ICD-10-CM | POA: Diagnosis not present

## 2015-02-21 DIAGNOSIS — D692 Other nonthrombocytopenic purpura: Secondary | ICD-10-CM | POA: Diagnosis not present

## 2015-02-21 DIAGNOSIS — E784 Other hyperlipidemia: Secondary | ICD-10-CM | POA: Diagnosis not present

## 2015-02-21 DIAGNOSIS — E038 Other specified hypothyroidism: Secondary | ICD-10-CM | POA: Diagnosis not present

## 2015-02-21 DIAGNOSIS — G5601 Carpal tunnel syndrome, right upper limb: Secondary | ICD-10-CM | POA: Diagnosis not present

## 2015-05-15 ENCOUNTER — Other Ambulatory Visit (HOSPITAL_COMMUNITY): Payer: Self-pay | Admitting: *Deleted

## 2015-05-16 ENCOUNTER — Ambulatory Visit (HOSPITAL_COMMUNITY)
Admission: RE | Admit: 2015-05-16 | Discharge: 2015-05-16 | Disposition: A | Discharge status: Home / Self Care | Payer: Medicare Other | Source: Ambulatory Visit | Attending: Internal Medicine | Admitting: Internal Medicine

## 2015-05-16 DIAGNOSIS — M810 Age-related osteoporosis without current pathological fracture: Secondary | ICD-10-CM | POA: Diagnosis not present

## 2015-05-16 MED ORDER — DENOSUMAB 60 MG/ML ~~LOC~~ SOLN
60.0000 mg | Freq: Once | SUBCUTANEOUS | Status: AC
  Administered 2015-05-16: 60 mg via SUBCUTANEOUS
  Filled 2015-05-16: qty 1

## 2015-06-05 DIAGNOSIS — E1129 Type 2 diabetes mellitus with other diabetic kidney complication: Secondary | ICD-10-CM | POA: Diagnosis not present

## 2015-06-05 DIAGNOSIS — D6489 Other specified anemias: Secondary | ICD-10-CM | POA: Diagnosis not present

## 2015-06-05 DIAGNOSIS — E784 Other hyperlipidemia: Secondary | ICD-10-CM | POA: Diagnosis not present

## 2015-06-05 DIAGNOSIS — E038 Other specified hypothyroidism: Secondary | ICD-10-CM | POA: Diagnosis not present

## 2015-06-05 DIAGNOSIS — E559 Vitamin D deficiency, unspecified: Secondary | ICD-10-CM | POA: Diagnosis not present

## 2015-06-05 DIAGNOSIS — M109 Gout, unspecified: Secondary | ICD-10-CM | POA: Diagnosis not present

## 2015-06-12 DIAGNOSIS — Z6824 Body mass index (BMI) 24.0-24.9, adult: Secondary | ICD-10-CM | POA: Diagnosis not present

## 2015-06-12 DIAGNOSIS — M810 Age-related osteoporosis without current pathological fracture: Secondary | ICD-10-CM | POA: Diagnosis not present

## 2015-06-12 DIAGNOSIS — E784 Other hyperlipidemia: Secondary | ICD-10-CM | POA: Diagnosis not present

## 2015-06-12 DIAGNOSIS — M79673 Pain in unspecified foot: Secondary | ICD-10-CM | POA: Diagnosis not present

## 2015-06-12 DIAGNOSIS — D692 Other nonthrombocytopenic purpura: Secondary | ICD-10-CM | POA: Diagnosis not present

## 2015-06-12 DIAGNOSIS — R42 Dizziness and giddiness: Secondary | ICD-10-CM | POA: Diagnosis not present

## 2015-06-12 DIAGNOSIS — E119 Type 2 diabetes mellitus without complications: Secondary | ICD-10-CM | POA: Diagnosis not present

## 2015-06-12 DIAGNOSIS — I129 Hypertensive chronic kidney disease with stage 1 through stage 4 chronic kidney disease, or unspecified chronic kidney disease: Secondary | ICD-10-CM | POA: Diagnosis not present

## 2015-06-12 DIAGNOSIS — E038 Other specified hypothyroidism: Secondary | ICD-10-CM | POA: Diagnosis not present

## 2015-06-12 DIAGNOSIS — R413 Other amnesia: Secondary | ICD-10-CM | POA: Diagnosis not present

## 2015-06-12 DIAGNOSIS — N184 Chronic kidney disease, stage 4 (severe): Secondary | ICD-10-CM | POA: Diagnosis not present

## 2015-06-13 ENCOUNTER — Other Ambulatory Visit: Payer: Self-pay | Admitting: Internal Medicine

## 2015-06-13 DIAGNOSIS — R42 Dizziness and giddiness: Secondary | ICD-10-CM

## 2015-06-26 ENCOUNTER — Ambulatory Visit
Admission: RE | Admit: 2015-06-26 | Discharge: 2015-06-26 | Disposition: A | Discharge status: Home / Self Care | Payer: Medicare Other | Source: Ambulatory Visit | Attending: Internal Medicine | Admitting: Internal Medicine

## 2015-06-26 DIAGNOSIS — R42 Dizziness and giddiness: Secondary | ICD-10-CM | POA: Diagnosis not present

## 2015-07-21 DIAGNOSIS — S0083XA Contusion of other part of head, initial encounter: Secondary | ICD-10-CM | POA: Diagnosis not present

## 2015-07-21 DIAGNOSIS — S51812A Laceration without foreign body of left forearm, initial encounter: Secondary | ICD-10-CM | POA: Diagnosis not present

## 2015-08-25 DIAGNOSIS — E119 Type 2 diabetes mellitus without complications: Secondary | ICD-10-CM | POA: Diagnosis not present

## 2015-09-26 DIAGNOSIS — H02054 Trichiasis without entropian left upper eyelid: Secondary | ICD-10-CM | POA: Diagnosis not present

## 2015-10-06 ENCOUNTER — Other Ambulatory Visit: Payer: Self-pay | Admitting: Internal Medicine

## 2015-10-06 DIAGNOSIS — Z1231 Encounter for screening mammogram for malignant neoplasm of breast: Secondary | ICD-10-CM

## 2015-10-10 ENCOUNTER — Ambulatory Visit
Admission: RE | Admit: 2015-10-10 | Discharge: 2015-10-10 | Disposition: A | Discharge status: Home / Self Care | Payer: Medicare Other | Source: Ambulatory Visit | Attending: Internal Medicine | Admitting: Internal Medicine

## 2015-10-10 DIAGNOSIS — Z1231 Encounter for screening mammogram for malignant neoplasm of breast: Secondary | ICD-10-CM | POA: Insufficient documentation

## 2015-10-16 DIAGNOSIS — E038 Other specified hypothyroidism: Secondary | ICD-10-CM | POA: Diagnosis not present

## 2015-10-16 DIAGNOSIS — M109 Gout, unspecified: Secondary | ICD-10-CM | POA: Diagnosis not present

## 2015-10-16 DIAGNOSIS — R358 Other polyuria: Secondary | ICD-10-CM | POA: Diagnosis not present

## 2015-10-16 DIAGNOSIS — E559 Vitamin D deficiency, unspecified: Secondary | ICD-10-CM | POA: Diagnosis not present

## 2015-10-16 DIAGNOSIS — E119 Type 2 diabetes mellitus without complications: Secondary | ICD-10-CM | POA: Diagnosis not present

## 2015-10-16 DIAGNOSIS — R8299 Other abnormal findings in urine: Secondary | ICD-10-CM | POA: Diagnosis not present

## 2015-10-16 DIAGNOSIS — E784 Other hyperlipidemia: Secondary | ICD-10-CM | POA: Diagnosis not present

## 2015-10-23 DIAGNOSIS — J3089 Other allergic rhinitis: Secondary | ICD-10-CM | POA: Diagnosis not present

## 2015-10-23 DIAGNOSIS — N184 Chronic kidney disease, stage 4 (severe): Secondary | ICD-10-CM | POA: Diagnosis not present

## 2015-10-23 DIAGNOSIS — D692 Other nonthrombocytopenic purpura: Secondary | ICD-10-CM | POA: Diagnosis not present

## 2015-10-23 DIAGNOSIS — Z6823 Body mass index (BMI) 23.0-23.9, adult: Secondary | ICD-10-CM | POA: Diagnosis not present

## 2015-10-23 DIAGNOSIS — R600 Localized edema: Secondary | ICD-10-CM | POA: Diagnosis not present

## 2015-10-23 DIAGNOSIS — Z0000 Encounter for general adult medical examination without abnormal findings: Secondary | ICD-10-CM | POA: Diagnosis not present

## 2015-10-23 DIAGNOSIS — Z23 Encounter for immunization: Secondary | ICD-10-CM | POA: Diagnosis not present

## 2015-10-23 DIAGNOSIS — I129 Hypertensive chronic kidney disease with stage 1 through stage 4 chronic kidney disease, or unspecified chronic kidney disease: Secondary | ICD-10-CM | POA: Diagnosis not present

## 2015-10-23 DIAGNOSIS — E119 Type 2 diabetes mellitus without complications: Secondary | ICD-10-CM | POA: Diagnosis not present

## 2015-10-23 DIAGNOSIS — I8390 Asymptomatic varicose veins of unspecified lower extremity: Secondary | ICD-10-CM | POA: Diagnosis not present

## 2015-10-23 DIAGNOSIS — M109 Gout, unspecified: Secondary | ICD-10-CM | POA: Diagnosis not present

## 2015-10-23 DIAGNOSIS — E784 Other hyperlipidemia: Secondary | ICD-10-CM | POA: Diagnosis not present

## 2015-11-22 DIAGNOSIS — H02054 Trichiasis without entropian left upper eyelid: Secondary | ICD-10-CM | POA: Diagnosis not present

## 2016-03-15 DIAGNOSIS — R198 Other specified symptoms and signs involving the digestive system and abdomen: Secondary | ICD-10-CM | POA: Diagnosis not present

## 2016-03-15 DIAGNOSIS — E785 Hyperlipidemia, unspecified: Secondary | ICD-10-CM | POA: Diagnosis not present

## 2016-03-15 DIAGNOSIS — M810 Age-related osteoporosis without current pathological fracture: Secondary | ICD-10-CM | POA: Diagnosis not present

## 2016-03-15 DIAGNOSIS — Z6823 Body mass index (BMI) 23.0-23.9, adult: Secondary | ICD-10-CM | POA: Diagnosis not present

## 2016-03-15 DIAGNOSIS — R413 Other amnesia: Secondary | ICD-10-CM | POA: Diagnosis not present

## 2016-03-15 DIAGNOSIS — I129 Hypertensive chronic kidney disease with stage 1 through stage 4 chronic kidney disease, or unspecified chronic kidney disease: Secondary | ICD-10-CM | POA: Diagnosis not present

## 2016-03-15 DIAGNOSIS — D692 Other nonthrombocytopenic purpura: Secondary | ICD-10-CM | POA: Diagnosis not present

## 2016-03-15 DIAGNOSIS — E559 Vitamin D deficiency, unspecified: Secondary | ICD-10-CM | POA: Diagnosis not present

## 2016-03-15 DIAGNOSIS — E119 Type 2 diabetes mellitus without complications: Secondary | ICD-10-CM | POA: Diagnosis not present

## 2016-03-15 DIAGNOSIS — E784 Other hyperlipidemia: Secondary | ICD-10-CM | POA: Diagnosis not present

## 2016-03-15 DIAGNOSIS — E038 Other specified hypothyroidism: Secondary | ICD-10-CM | POA: Diagnosis not present

## 2016-03-15 DIAGNOSIS — I10 Essential (primary) hypertension: Secondary | ICD-10-CM | POA: Diagnosis not present

## 2016-03-15 DIAGNOSIS — N184 Chronic kidney disease, stage 4 (severe): Secondary | ICD-10-CM | POA: Diagnosis not present

## 2016-03-21 ENCOUNTER — Other Ambulatory Visit (HOSPITAL_COMMUNITY): Payer: Self-pay | Admitting: *Deleted

## 2016-03-22 ENCOUNTER — Ambulatory Visit (HOSPITAL_COMMUNITY)
Admission: RE | Admit: 2016-03-22 | Discharge: 2016-03-22 | Disposition: A | Discharge status: Home / Self Care | Payer: Medicare Other | Source: Ambulatory Visit | Attending: Internal Medicine | Admitting: Internal Medicine

## 2016-03-22 DIAGNOSIS — M810 Age-related osteoporosis without current pathological fracture: Secondary | ICD-10-CM | POA: Diagnosis not present

## 2016-03-22 MED ORDER — DENOSUMAB 60 MG/ML ~~LOC~~ SOLN
60.0000 mg | Freq: Once | SUBCUTANEOUS | Status: AC
  Administered 2016-03-22: 60 mg via SUBCUTANEOUS
  Filled 2016-03-22: qty 1

## 2016-05-16 DIAGNOSIS — N184 Chronic kidney disease, stage 4 (severe): Secondary | ICD-10-CM | POA: Diagnosis not present

## 2016-05-16 DIAGNOSIS — R197 Diarrhea, unspecified: Secondary | ICD-10-CM | POA: Diagnosis not present

## 2016-05-16 DIAGNOSIS — R2689 Other abnormalities of gait and mobility: Secondary | ICD-10-CM | POA: Diagnosis not present

## 2016-05-16 DIAGNOSIS — Z6822 Body mass index (BMI) 22.0-22.9, adult: Secondary | ICD-10-CM | POA: Diagnosis not present

## 2016-05-16 DIAGNOSIS — J3089 Other allergic rhinitis: Secondary | ICD-10-CM | POA: Diagnosis not present

## 2016-05-16 DIAGNOSIS — R209 Unspecified disturbances of skin sensation: Secondary | ICD-10-CM | POA: Diagnosis not present

## 2016-05-16 DIAGNOSIS — E119 Type 2 diabetes mellitus without complications: Secondary | ICD-10-CM | POA: Diagnosis not present

## 2016-05-16 DIAGNOSIS — I10 Essential (primary) hypertension: Secondary | ICD-10-CM | POA: Diagnosis not present

## 2016-06-06 ENCOUNTER — Ambulatory Visit: Payer: Medicare Other | Attending: Internal Medicine | Admitting: Physical Therapy

## 2016-06-06 ENCOUNTER — Encounter: Payer: Self-pay | Admitting: Physical Therapy

## 2016-06-06 DIAGNOSIS — R2681 Unsteadiness on feet: Secondary | ICD-10-CM | POA: Insufficient documentation

## 2016-06-06 DIAGNOSIS — M6281 Muscle weakness (generalized): Secondary | ICD-10-CM | POA: Insufficient documentation

## 2016-06-06 DIAGNOSIS — Z9181 History of falling: Secondary | ICD-10-CM | POA: Insufficient documentation

## 2016-06-06 DIAGNOSIS — R2689 Other abnormalities of gait and mobility: Secondary | ICD-10-CM | POA: Insufficient documentation

## 2016-06-06 NOTE — Therapy (Signed)
[facility] [address] [place], [place], [address] Phone: [phone]   Fax:  [phone]  Physical Therapy Evaluation  Patient Details  Name: Rebecca Sellers MRN: [id number] Date of Birth: Sep 23, 1930 Referring Provider: Shon Baton MD    Encounter Date: 06/06/2016      PT End of Session - 06/06/16 1500    Visit Number 1   Number of Visits 17   Date for PT Re-Evaluation 08/05/16   Authorization Type Medicare and G-codes    PT Start Time 0848   PT Stop Time 0935   PT Time Calculation (min) 47 min   Equipment Utilized During Treatment Gait belt   Activity Tolerance Patient tolerated treatment well   Behavior During Therapy [facility] for tasks assessed/performed      Past Medical History:  Diagnosis Date  . Bilateral swelling of feet   . Bladder problem   . Bruises easily   . Carpal tunnel syndrome 02/20/2015   Bilateral  . Diabetes ([place])   . Fatigue   . Hypertension   . Memory loss   . Osteoarthritis   . Osteoporosis   . Reflux   . Thyroid disease     Past Surgical History:  Procedure Laterality Date  . CHOLECYSTECTOMY      There were no vitals filed for this visit.       Subjective Assessment - 06/06/16 0853    Subjective Patient is a 83 year old female presenting to PT due to imbalance and gait instability. Patient reports she has noticed instability and imbalance while ambulating especially when turning. The patient reports she has had one fall over a rug approximately 4 weeks ago. Patient denies any positional dizziness/vertigo. Patient reports occasional difficulty maintaining balance when rushing to get to the bathroom with recent bouts of diarrhea, which she reports is related to her medications. Patient reports an intermittent burning sensation in her feet. She reports she occasionally wears compression stockings, and they seem to help alleviate this sensation.    Pertinent History Fall with L  shoulder injury, LBP, OA, osteoporosis, type 2 DM, HTN   Limitations Lifting;Standing;Walking;House hold activities   Patient Stated Goals improve balance and stability, improve LBP and shoulder pain    Currently in Pain? Yes   Pain Location Shoulder   Pain Orientation Left   Pain Descriptors / Indicators Sore   Pain Type Acute pain   Pain Onset 1 to 4 weeks ago   Pain Frequency Intermittent   Aggravating Factors  unknown    Pain Relieving Factors unknown             [facility] PT Assessment - 06/06/16 0850      Assessment   Medical Diagnosis Gait and postural instability    Referring Provider Shon Baton MD     Onset Date/Surgical Date 05/31/16  PT referral    Hand Dominance Right     Precautions   Precautions Fall     Balance Screen   Has the patient fallen in the past 6 months Yes   How many times? 1  tripped over throw rug    Has the patient had a decrease in activity level because of a fear of falling?  No   Is the patient reluctant to leave their home because of a fear of falling?  No     Home Environment   Living Environment Private residence   Living Arrangements Alone   Type of Home Other(Comment)  townhouse  Home Access Stairs to enter  none    Entrance Stairs-Number of Steps 1   Entrance Stairs-Rails None   Home Layout One level   Home Equipment Shower seat;Grab bars - tub/shower;Grab bars - toilet     Prior Function   Level of Independence Independent   Vocation Retired  retired in March    Leisure caring for friends, going to [employer], going out with friends to shop/eat     Observation/Other Assessments   Focus on Therapeutic Outcomes (FOTO)  5 (30% limited; predicted 24% limited)   Other Surveys  Other Surveys   Activities of Balance Confidence Scale (ABC Scale)  45.0%     Sensation   Light Touch Appears Intact  in BLEs      Posture/Postural Control   Posture/Postural Control Postural limitations   Postural Limitations Rounded  Shoulders;Forward head     Strength   Right Ankle Dorsiflexion 4/5   Left Ankle Dorsiflexion 4-/5     Transfers   Transfers Sit to Stand;Stand to Sit   Sit to Stand 5: Supervision;With upper extremity assist;From bed  utilizes UE support    Stand to Sit 5: Supervision;With upper extremity assist;To bed     Ambulation/Gait   Ambulation/Gait Yes   Ambulation/Gait Assistance 5: Supervision;4: Min guard  supervision on straight path; min guard when turning   Ambulation/Gait Assistance Details Patient requires cueing to slow when turning during ambulation due to imbalance   Ambulation Distance (Feet) 100 Feet   Assistive device None   Gait Pattern Step-to pattern;Decreased arm swing - right;Decreased arm swing - left;Decreased stride length;Decreased dorsiflexion - right;Decreased dorsiflexion - left;Poor foot clearance - left;Poor foot clearance - right   Ambulation Surface Level;Indoor     High Level Balance   High Level Balance Activites Other (comment)   High Level Balance Comments Patient able to stand in SLS on LLE for approximately 1s, and able to stand in SLS on RLE for approximately 3s. PT performed MCTSIB test with patient. Patient able to maintain position on all 4 conditions, but required a second attempt on position 4 (foam+EC) in order to maintain position for 30 seconds.            Vestibular Assessment - 06/06/16 0918      Orthostatics   BP supine (x 5 minutes) 147/75   HR supine (x 5 minutes) 78   BP sitting 160/78   HR sitting 78   BP standing (after 1 minute) 162/78   HR standing (after 1 minute) 75   BP standing (after 3 minutes) 135/71   HR standing (after 3 minutes) 75                       PT Education - 06/06/16 1458    Education provided Yes   Education Details plan of care    Methods Explanation   Comprehension Verbalized understanding          PT Short Term Goals - 06/06/16 1518      PT SHORT TERM GOAL #1   Title Patient  will demonstrate understanding of initial HEP. (Target Date: 07/06/2016)    Time 4   Period Weeks   Status New     PT SHORT TERM GOAL #2   Title Patient will demonstrate ability to perform 4 alternating foot taps on 4 inch step without UE support with min guard to indicate progress in her ability to perform single limb stance and to indicate a  decrease in her risk of falling. (Target Date: 07/06/2016)    Time 4   Period Weeks   Status New     PT SHORT TERM GOAL #3   Title Patient will demonstrate ability to ambulate 300 feet over outdoor surfaces (grass/gravel) including ramp/curbs/stairs (1 rail) Mod I to indicate a decrease in her risk of falling when ambulating in the community. (Target Date: 07/06/2016)   Time 4   Period Weeks   Status New     PT SHORT TERM GOAL #4   Title Pt will participate in gait velocity and FGA assessments with LTG to be set.   Time 4   Period Weeks   Status New           PT Long Term Goals - 06/06/16 1510      PT LONG TERM GOAL #1   Title Patient will demonstrate independence with balance HEP. (Target Date: 08/05/2016)    Time 8   Period Weeks   Status New     PT LONG TERM GOAL #2   Title Patient will be able to perform SLS bilaterally for >/=5 seconds to indicate a decrease in her risk of falling. (Target Date: 08/05/2016)    Time 8   Period Weeks   Status New     PT LONG TERM GOAL #3   Title SOT will be performed, and goal will be set. (Target Date: 08/05/2016)    Time 8   Period Weeks   Status New     PT LONG TERM GOAL #4   Title FGA goal TBD. (Target Date: 08/05/2016)    Baseline TBD   Time --   Period --   Status --     PT LONG TERM GOAL #5   Title Patient will score >/=75% on the ABC scale. (Target Date: 08/05/2016)    Time 8   Period Weeks   Status New     Additional Long Term Goals   Additional Long Term Goals Yes     PT LONG TERM GOAL #6   Title Gait velocity goal TBD   Baseline TBD               Plan - 06/06/16 1501     Clinical Impression Statement Patient is a 83 year old female presenting to OPPT neuro for low complexity PT evaluation due to gait and postural instability and repeated falls. Patient's past medical history is significant for the following: DM type 2, HTN, hyperlipidemia, hypothyroid, GERD, diverticulosis, chronic cystitis, anemia, osteopenia, OA, CKD stage 3. The following deficits were noted during the patient's exam: impaired gait including LOB with changes in direction, decreased foot clearance, and single limb stance time, impaired balance especially when on compliant surfaces and without vision, an pain: neuropathic pain in bilat feet as well as LBP and L shoulder pain due to recent fall (will continue to monitor but will not be directly address with the episode of care). The patient's gait mechanics and history of falls indicate she is at higher risk for repeated falls. Patient would benefit from skilled PT to address these impairments and functional limitations to maximize functional mobility independence and reduce falls risk.    Rehab Potential Good   Clinical Impairments Affecting Rehab Potential DM type 2, HTN, hyperlipidemia, hypothyroid, GERD, diverticulosis, chronic cystitis, anemia, osteopenia, OA, CKD stage 3    PT Frequency 2x / week   PT Duration 8 weeks   PT Treatment/Interventions ADLs/Self Care Home Management;Electrical Stimulation;Neuromuscular re-education;Balance training;Therapeutic  exercise;Therapeutic activities;Functional mobility training;Stair training;Gait training;DME Instruction;Patient/family education;Manual techniques;Passive range of motion;Moist Heat;Orthotic Fit/Training;Taping;Vestibular   PT Next Visit Plan Assess FGA and gait velocity-set LTG; initiate HEP-focus on ankle strengthening, balance strategies, foot clearance during gait   Consulted and Agree with Plan of Care Patient      Patient will benefit from skilled therapeutic intervention in order to  improve the following deficits and impairments:  Abnormal gait, Decreased balance, Decreased mobility, Decreased knowledge of use of DME, Decreased strength, Difficulty walking, Postural dysfunction, Pain, Decreased range of motion  Visit Diagnosis: Unsteadiness on feet - Plan: PT plan of care cert/re-cert  Muscle weakness (generalized) - Plan: PT plan of care cert/re-cert  Other abnormalities of gait and mobility - Plan: PT plan of care cert/re-cert  History of falling - Plan: PT plan of care cert/re-cert      G-Codes - 73/41/93 1524    Functional Assessment Tool Used (Outpatient Only) ABC score, clinical judgment   Functional Limitation Mobility: Walking and moving around   Mobility: Walking and Moving Around Current Status ([id number]) At least 40 percent but less than 60 percent impaired, limited or restricted   Mobility: Walking and Moving Around Goal Status [phone]) At least 1 percent but less than 20 percent impaired, limited or restricted       Problem List Patient Active Problem List   Diagnosis Date Noted  . Carpal tunnel syndrome 02/20/2015    Arelia Sneddon, SPT  06/07/2016  8:12AM  Raylene Everts, PT, DPT 06/07/16    7:36 AM    [facility] [address] [place], [place], [address] Phone: [phone]   Fax:  [phone]  Name: Rebecca Sellers MRN: [id number] Date of Birth: [birth year]

## 2016-06-12 ENCOUNTER — Ambulatory Visit: Payer: Medicare Other | Admitting: Physical Therapy

## 2016-06-12 ENCOUNTER — Encounter: Payer: Self-pay | Admitting: Physical Therapy

## 2016-06-12 DIAGNOSIS — M6281 Muscle weakness (generalized): Secondary | ICD-10-CM | POA: Diagnosis not present

## 2016-06-12 DIAGNOSIS — R2681 Unsteadiness on feet: Secondary | ICD-10-CM

## 2016-06-12 DIAGNOSIS — R2689 Other abnormalities of gait and mobility: Secondary | ICD-10-CM | POA: Diagnosis not present

## 2016-06-12 DIAGNOSIS — Z9181 History of falling: Secondary | ICD-10-CM

## 2016-06-12 NOTE — Patient Instructions (Signed)
Feet Heel-Toe "Tandem"    Arms touching countertop, walk a straight line bringing one foot directly in front of the other.  Walk forwards along counter and then backwards. Do __2__ sessions per day.  Copyright  VHI. All rights reserved.  SINGLE LIMB STANCE    Stance: single leg on floor, holding onto counter top with one hand. Raise leg. Hold _10-12__ seconds. Repeat with other leg. _2-3__ reps per set, __2_ sets per day  Copyright  VHI. All rights reserved.  Walking on Heels    Walk on heels forwards holding counter top. Do _2__ sessions per day.  Copyright  VHI. All rights reserved.   Walking on Toes    Walk on toes forwards holding countertop. Do __2__ sessions per day.  Copyright  VHI. All rights reserved.  Forward / Backward Progression With 180 (Half) Turns    Walk making a slow half turn in place, leading with head and eyes, toward left every __10__ steps. Alternate walking forward or backward between turns, maintaining a straight path. Repeat sequence __4__ times per session. Do __2__ sessions per day.

## 2016-06-12 NOTE — Therapy (Signed)
[facility] [address] [place], [place], [address] Phone: [phone]   Fax:  [phone]  Physical Therapy Treatment  Patient Details  Name: Rebecca Sellers MRN: [id number] Date of Birth: [birth year] Referring Provider: Shon Baton MD    Encounter Date: 06/12/2016      PT End of Session - 06/12/16 1404    Visit Number 2   Number of Visits 17   Date for PT Re-Evaluation 08/05/16   Authorization Type Medicare and G-codes    PT Start Time 9675   PT Stop Time 1401   PT Time Calculation (min) 44 min   Activity Tolerance Patient tolerated treatment well   Behavior During Therapy [facility] for tasks assessed/performed      Past Medical History:  Diagnosis Date  . Bilateral swelling of feet   . Bladder problem   . Bruises easily   . Carpal tunnel syndrome 02/20/2015   Bilateral  . Diabetes ([place])   . Fatigue   . Hypertension   . Memory loss   . Osteoarthritis   . Osteoporosis   . Reflux   . Thyroid disease     Past Surgical History:  Procedure Laterality Date  . CHOLECYSTECTOMY      There were no vitals filed for this visit.      Subjective Assessment - 06/12/16 1321    Subjective Pt doing well, no falls and has not felt as off balance.  Asking about transitioning to [facility] due to it being a mile away from her house.   Pertinent History Fall with L shoulder injury, LBP, OA, osteoporosis, type 2 DM, HTN   Limitations Lifting;Standing;Walking;House hold activities   Diagnostic tests X-Rays    Patient Stated Goals improve balance and stability, improve LBP and shoulder pain    Currently in Pain? Yes   Pain Score 2    Pain Location Shoulder   Pain Orientation Left   Pain Descriptors / Indicators Sore   Pain Type Acute pain   Pain Relieving Factors has been using massage chair            [facility] PT Assessment - 06/12/16 1324      Standardized Balance Assessment   Standardized Balance Assessment 10 meter  walk test   10 Meter Walk 9.19 seconds or 3.56 ft/sec     Functional Gait  Assessment   Gait assessed  Yes   Gait Level Surface Walks 20 ft in less than 5.5 sec, no assistive devices, good speed, no evidence for imbalance, normal gait pattern, deviates no more than 6 in outside of the 12 in walkway width.   Change in Gait Speed Able to smoothly change walking speed without loss of balance or gait deviation. Deviate no more than 6 in outside of the 12 in walkway width.   Gait with Horizontal Head Turns Performs head turns smoothly with no change in gait. Deviates no more than 6 in outside 12 in walkway width   Gait with Vertical Head Turns Performs head turns with no change in gait. Deviates no more than 6 in outside 12 in walkway width.   Gait and Pivot Turn Pivot turns safely within 3 sec and stops quickly with no loss of balance.   Step Over Obstacle Is able to step over one shoe box (4.5 in total height) without changing gait speed. No evidence of imbalance.   Gait with Narrow Base of Support Ambulates 4-7 steps.   Gait with Eyes Closed Walks 20  ft, slow speed, abnormal gait pattern, evidence for imbalance, deviates 10-15 in outside 12 in walkway width. Requires more than 9 sec to ambulate 20 ft.   Ambulating Backwards Walks 20 ft, slow speed, abnormal gait pattern, evidence for imbalance, deviates 10-15 in outside 12 in walkway width.   Steps Alternating feet, must use rail.   Total Score 22   FGA comment: 22/30      Functional Gait Assessment (FGA)  Scores of ? 22/30 on the FGA were found to be effective in predicting falls, Sensitivity 85%, Specificity 86% Scores of ? 20/30 on the FGA were optimal to predict older adults residing in community dwellings who would sustain unexplained falls in the next 6 months, Sensitivity 100%, Specificity 76% ([place], 2010; aged 81 to 79, Older Adults) [place]: 4.2 points for CVA Augustin Coupe et al, 2010) [facility]: 8 points for Balance and Vestibular  Disorders Marjorie Smolder and Augustin Coupe, 2014)                      Balance Exercises - 06/12/16 1339      Balance Exercises: Standing   SLS Eyes open;Solid surface;Upper extremity support 1;2 reps;10 secs   Tandem Gait Forward;Retro;Upper extremity support;4 reps   Turning Right;Left;5 reps  while ambulating forwards and retro     OTAGO PROGRAM   Heel Walking Support  x 4 reps   Toe Walk Support  x 4 reps           PT Education - 06/12/16 1404    Education provided Yes   Education Details HEP, transition to [facility]   Person(s) Educated Patient   Methods Explanation;Handout;Demonstration   Comprehension Verbalized understanding;Returned demonstration          PT Short Term Goals - 06/12/16 1720      PT SHORT TERM GOAL #1   Title Patient will demonstrate understanding of initial HEP. (Target Date: 07/06/2016)    Time 4   Period Weeks   Status New     PT SHORT TERM GOAL #2   Title Patient will demonstrate ability to perform 4 alternating foot taps on 4 inch step without UE support with min guard to indicate progress in her ability to perform single limb stance and to indicate a decrease in her risk of falling. (Target Date: 07/06/2016)    Time 4   Period Weeks   Status New     PT SHORT TERM GOAL #3   Title Patient will demonstrate ability to ambulate 300 feet over outdoor surfaces (grass/gravel) including ramp/curbs/stairs (1 rail) Mod I to indicate a decrease in her risk of falling when ambulating in the community. (Target Date: 07/06/2016)   Time 4   Period Weeks   Status New     PT SHORT TERM GOAL #4   Title Pt will participate in gait velocity and FGA assessments with LTG to be set.   Baseline Met 06/12/2016-LTG set   Time 4   Period Weeks   Status Achieved           PT Long Term Goals - 06/12/16 1721      PT LONG TERM GOAL #1   Title Patient will demonstrate independence with balance HEP. (Target Date: 08/05/2016)    Time 8   Period Weeks   Status  New     PT LONG TERM GOAL #2   Title Patient will be able to perform SLS bilaterally for >/=5 seconds to indicate a decrease in her risk of  falling. (Target Date: 08/05/2016)    Time 8   Period Weeks   Status New     PT LONG TERM GOAL #3   Title SOT will be performed, and goal will be set. (Target Date: 08/05/2016)    Time 8   Period Weeks   Status New     PT LONG TERM GOAL #4   Title Pt will improve safety with gait and demonstrate decreased falls risk with FGA score of =/> 24 (Target Date: 08/05/2016)    Baseline 22/30   Time 8   Status Revised     PT LONG TERM GOAL #5   Title Patient will score >/=75% on the ABC scale. (Target Date: 08/05/2016)    Time 8   Period Weeks   Status New     PT LONG TERM GOAL #6   Title No gait velocity goal required-pt gait velocity WFL   Baseline 3.56 ft/sec   Status Deferred               Plan - 06/12/16 1723    Clinical Impression Statement Treatment session today focused on further assessment of patient's safety and falls risk.  Pt's gait velocity is WFL for community ambulator, no goal needed.  Pt's FGA score does indicate pt is at increased risk for falls-pt had increased difficulty with ambulating with narrow BOS, with vision removed, and stepping over obstacles.  Provided pt with initial standing balance HEP and educated pt on movements to avoid due to osteoporosis present in her spinal column.  Due to patient living in [place] and previously receiving OPPT at [facility] pt to transition to therapist there.    Rehab Potential Good   Clinical Impairments Affecting Rehab Potential DM type 2, HTN, hyperlipidemia, hypothyroid, GERD, diverticulosis, chronic cystitis, anemia, osteopenia, OA, CKD stage 3    PT Frequency 2x / week   PT Duration 8 weeks   PT Treatment/Interventions ADLs/Self Care Home Management;Electrical Stimulation;Neuromuscular re-education;Balance training;Therapeutic exercise;Therapeutic activities;Functional mobility  training;Stair training;Gait training;DME Instruction;Patient/family education;Manual techniques;Passive range of motion;Moist Heat;Orthotic Fit/Training;Taping;Vestibular   PT Next Visit Plan review HEP-focus on ankle strengthening, balance strategies, foot clearance during gait, compliant surfaces, turning safely   Consulted and Agree with Plan of Care Patient      Patient will benefit from skilled therapeutic intervention in order to improve the following deficits and impairments:  Abnormal gait, Decreased balance, Decreased mobility, Decreased knowledge of use of DME, Decreased strength, Difficulty walking, Postural dysfunction, Pain, Decreased range of motion  Visit Diagnosis: Unsteadiness on feet  Muscle weakness (generalized)  Other abnormalities of gait and mobility  History of falling     Problem List Patient Active Problem List   Diagnosis Date Noted  . Carpal tunnel syndrome 02/20/2015   Raylene Everts, PT, DPT 06/12/16    5:29 PM    [facility] [address] [place], [place], [address] Phone: [phone]   Fax:  [phone]  Name: Rebecca Sellers MRN: [id number] Date of Birth: 03/15/1930

## 2016-06-14 ENCOUNTER — Ambulatory Visit: Payer: PRIVATE HEALTH INSURANCE

## 2016-06-18 ENCOUNTER — Ambulatory Visit: Payer: PRIVATE HEALTH INSURANCE | Admitting: Physical Therapy

## 2016-06-19 ENCOUNTER — Ambulatory Visit: Payer: Medicare Other

## 2016-06-21 ENCOUNTER — Ambulatory Visit: Payer: Medicare Other | Attending: Internal Medicine | Admitting: Physical Therapy

## 2016-06-21 ENCOUNTER — Encounter: Payer: Self-pay | Admitting: Physical Therapy

## 2016-06-21 ENCOUNTER — Ambulatory Visit: Payer: PRIVATE HEALTH INSURANCE

## 2016-06-21 DIAGNOSIS — M6281 Muscle weakness (generalized): Secondary | ICD-10-CM | POA: Insufficient documentation

## 2016-06-21 DIAGNOSIS — Z9181 History of falling: Secondary | ICD-10-CM | POA: Insufficient documentation

## 2016-06-21 DIAGNOSIS — R2681 Unsteadiness on feet: Secondary | ICD-10-CM | POA: Insufficient documentation

## 2016-06-21 NOTE — Therapy (Signed)
[place] MAIN [facility] SERVICES [address] [place], [place], [address] Phone: [phone]   Fax:  [phone]  Physical Therapy Treatment  Patient Details  Name: Rebecca Sellers MRN: [id number] Date of Birth: Sep 02, 1930 Referring Provider: Shon Baton MD    Encounter Date: 06/21/2016      PT End of Session - 06/21/16 0927    Visit Number 3   Number of Visits 17   Date for PT Re-Evaluation 08/05/16   Authorization Type Medicare and G-codes    PT Start Time 0916   PT Stop Time 1000   PT Time Calculation (min) 44 min   Equipment Utilized During Treatment Gait belt   Activity Tolerance Patient tolerated treatment well   Behavior During Therapy [facility] for tasks assessed/performed      Past Medical History:  Diagnosis Date  . Bilateral swelling of feet   . Bladder problem   . Bruises easily   . Carpal tunnel syndrome 02/20/2015   Bilateral  . Diabetes ([place])   . Fatigue   . Hypertension   . Memory loss   . Osteoarthritis   . Osteoporosis   . Reflux   . Thyroid disease     Past Surgical History:  Procedure Laterality Date  . CHOLECYSTECTOMY      There were no vitals filed for this visit.      Subjective Assessment - 06/21/16 0926    Subjective Patient reports that she has not had any falls since last visit but reports that she has had several losses of balance where she has needed to touch for balance.    Pertinent History Fall with L shoulder injury, LBP, OA, osteoporosis, type 2 DM, HTN   Limitations Lifting;Standing;Walking;House hold activities   Diagnostic tests X-Rays    Patient Stated Goals improve balance and stability, improve LBP and shoulder pain    Currently in Pain? Yes   Pain Location Back   Pain Orientation Lower   Pain Descriptors / Indicators Aching   Pain Type Chronic pain  arthritis      Neuromuscular Re-education: On Airex balance beam, performed sideways stepping 5' times 6 reps. On Airex balance  beam, performed sideways static stance while performing mini-squats 20 reps. On Airex balance beam, performed tandem walking 5' times 6 reps with faded UEs support progressing to intermittent few finger touch.  Airex Pad: On Airex pad, patient transferred small foam balls from bin placed about 1' off the ground to recreate some forward bending activities as patient reports she gets imbalance when she bends over and when she has to turn and then turned at the waist without moving feet to place balls in bin placed in a chair. Encouraged patient to bend down to retrieve balls by bending her knees to limit amount of forward trunk flexion in patient with osteoporosis. Patient was able to perform without difficulty and no loss of balance. Repeated activity with patient moving her feet to turn to face the bin in the chair before placing balls in the bin. Patient able to perform without loss of balance.   Cone tapping: On AirEx pad, patient performed alternate foot cone tapping in series of one, two and three cones as called out by therapist. Patient demonstrated increased difficulty with SLS on right as compared to left side and required CGA. Patient initially had difficulty with series of 2 and 3 cone tapping but improved with practice. Patient intermittently reached for // bar for support for a few seconds.  Sit to Stand: Patient performed 10 reps sit to stand exercise with Airex pad under her feet. Patient demonstrated good eccentric control to sit down and was able to achieve standing balance without external supports. Patient exhibited signs of LEs fatigue reps 7-10. Added this exercise for HEP.          PT Education - 06/21/16 [phone]    Education provided Yes   Education Details discussed plan of care; added sit to stand to HEP; reviewed safety precautions with HEP   Person(s) Educated Patient   Methods Explanation;Demonstration;Handout   Comprehension Verbalized understanding;Returned  demonstration          PT Short Term Goals - 06/12/16 1720      PT SHORT TERM GOAL #1   Title Patient will demonstrate understanding of initial HEP. (Target Date: 07/06/2016)    Time 4   Period Weeks   Status New     PT SHORT TERM GOAL #2   Title Patient will demonstrate ability to perform 4 alternating foot taps on 4 inch step without UE support with min guard to indicate progress in her ability to perform single limb stance and to indicate a decrease in her risk of falling. (Target Date: 07/06/2016)    Time 4   Period Weeks   Status New     PT SHORT TERM GOAL #3   Title Patient will demonstrate ability to ambulate 300 feet over outdoor surfaces (grass/gravel) including ramp/curbs/stairs (1 rail) Mod I to indicate a decrease in her risk of falling when ambulating in the community. (Target Date: 07/06/2016)   Time 4   Period Weeks   Status New     PT SHORT TERM GOAL #4   Title Pt will participate in gait velocity and FGA assessments with LTG to be set.   Baseline Met 06/12/2016-LTG set   Time 4   Period Weeks   Status Achieved           PT Long Term Goals - 06/12/16 1721      PT LONG TERM GOAL #1   Title Patient will demonstrate independence with balance HEP. (Target Date: 08/05/2016)    Time 8   Period Weeks   Status New     PT LONG TERM GOAL #2   Title Patient will be able to perform SLS bilaterally for >/=5 seconds to indicate a decrease in her risk of falling. (Target Date: 08/05/2016)    Time 8   Period Weeks   Status New     PT LONG TERM GOAL #3   Title SOT will be performed, and goal will be set. (Target Date: 08/05/2016)    Time 8   Period Weeks   Status New     PT LONG TERM GOAL #4   Title Pt will improve safety with gait and demonstrate decreased falls risk with FGA score of =/> 24 (Target Date: 08/05/2016)    Baseline 22/30   Time 8   Status Revised     PT LONG TERM GOAL #5   Title Patient will score >/=75% on the ABC scale. (Target Date: 08/05/2016)    Time  8   Period Weeks   Status New     PT LONG TERM GOAL #6   Title No gait velocity goal required-pt gait velocity [facility]   Baseline 3.56 ft/sec   Status Deferred               Plan - 06/21/16 0927    Clinical Impression Statement  Patient reports that she has the most difficulty with her balance when her feet are moving and not stationary. Patient states she has difficulty with turning, bending over and with getting up and down from a chair. Patient challenged by tandem walking, sit to stand and alternate feet cone tapping on Airex foam this date and SLS activities. Patient improves with practice with most balance tasks this date. Patient became fatigued after 7 reps of sit to stand exercise with Airex pad placed under her feet. Patient would benefit from further PT services to continue to address functional deficits and to try to reduce her falls risk.    Rehab Potential Good   Clinical Impairments Affecting Rehab Potential DM type 2, HTN, hyperlipidemia, hypothyroid, GERD, diverticulosis, chronic cystitis, anemia, osteopenia, OA, CKD stage 3    PT Frequency 2x / week   PT Duration 8 weeks   PT Treatment/Interventions ADLs/Self Care Home Management;Electrical Stimulation;Neuromuscular re-education;Balance training;Therapeutic exercise;Therapeutic activities;Functional mobility training;Stair training;Gait training;DME Instruction;Patient/family education;Manual techniques;Passive range of motion;Moist Heat;Orthotic Fit/Training;Taping;Vestibular   PT Next Visit Plan review HEP-focus on ankle strengthening, balance strategies, foot clearance during gait, compliant surfaces, turning safely   PT Home Exercise Plan tandem walking, SLS, heel and toe walking, walking with turns; sit to stand with pillow under feet   Consulted and Agree with Plan of Care Patient      Patient will benefit from skilled therapeutic intervention in order to improve the following deficits and impairments:  Abnormal  gait, Decreased balance, Decreased mobility, Decreased knowledge of use of DME, Decreased strength, Difficulty walking, Postural dysfunction, Pain, Decreased range of motion  Visit Diagnosis: Unsteadiness on feet  Muscle weakness (generalized)  History of falling     Problem List Patient Active Problem List   Diagnosis Date Noted  . Carpal tunnel syndrome 02/20/2015   Lady Deutscher PT, DPT [phone] Lady Deutscher 06/21/2016, 3:51 PM  [place] MAIN [facility] SERVICES [address] [place], [place], [address] Phone: [phone]   Fax:  [phone]  Name: DARLENY SEM MRN: [id number] Date of Birth: November 24, 1930

## 2016-06-24 ENCOUNTER — Ambulatory Visit: Payer: PRIVATE HEALTH INSURANCE | Admitting: Physical Therapy

## 2016-06-25 ENCOUNTER — Ambulatory Visit: Payer: Medicare Other

## 2016-06-25 VITALS — BP 151/70 | HR 88

## 2016-06-25 DIAGNOSIS — R2681 Unsteadiness on feet: Secondary | ICD-10-CM

## 2016-06-25 DIAGNOSIS — Z9181 History of falling: Secondary | ICD-10-CM | POA: Diagnosis not present

## 2016-06-25 DIAGNOSIS — M6281 Muscle weakness (generalized): Secondary | ICD-10-CM

## 2016-06-25 NOTE — Therapy (Signed)
[place] MAIN [facility] SERVICES [address] [place], [place], [address] Phone: [phone]   Fax:  [phone]  Physical Therapy Treatment  Patient Details  Name: Rebecca Sellers MRN: [id number] Date of Birth: 1930/10/29 Referring Provider: Shon Baton MD    Encounter Date: 06/25/2016      PT End of Session - 06/25/16 0958    Visit Number 4   Number of Visits 17   Date for PT Re-Evaluation 08/05/16   Authorization Type Medicare and G-codes    PT Start Time 0950   PT Stop Time 1035   PT Time Calculation (min) 45 min   Equipment Utilized During Treatment Gait belt   Activity Tolerance Patient tolerated treatment well   Behavior During Therapy [facility] for tasks assessed/performed      Past Medical History:  Diagnosis Date  . Bilateral swelling of feet   . Bladder problem   . Bruises easily   . Carpal tunnel syndrome 02/20/2015   Bilateral  . Diabetes ([place])   . Fatigue   . Hypertension   . Memory loss   . Osteoarthritis   . Osteoporosis   . Reflux   . Thyroid disease     Past Surgical History:  Procedure Laterality Date  . CHOLECYSTECTOMY      Vitals:   06/25/16 0954  BP: (!) 151/70  Pulse: 88  SpO2: 98%        Subjective Assessment - 06/25/16 0954    Subjective Pt reports that she is doing well today. She is having some low back pain secondary to moving some heavy flower pots yesterday. Otherwise no specific questions or concerns. She is performing HEP without issue   Pertinent History Fall with L shoulder injury, LBP, OA, osteoporosis, type 2 DM, HTN   Limitations Lifting;Standing;Walking;House hold activities   Diagnostic tests X-Rays    Patient Stated Goals improve balance and stability, improve LBP and shoulder pain    Currently in Pain? Yes   Pain Score 4    Pain Location Back   Pain Orientation Lower;Right;Left   Pain Descriptors / Indicators Aching   Pain Type Chronic pain   Pain Onset More than a month ago           TREATMENT  Ther-ex NuStep L2 x 5 minutes for warm-up during history; Quantum leg press 90# x 10, 105# x 20, 120# x 20; Sit to stand with Airex pad under feet, YTB around knees to prevent valgus 2 x 10;  Neuromuscular Re-education Airex Balance Beam On Airex balance beam, performed sideways stepping 5' x 6 reps, no UE support but CGA to minA+1 correction form therapist; On Airex balance beam, performed tandem gait 5' x 6 reps, no UE support but CGA to minA+1 correction form therapist;  Airex Pad: On Airex pad, with feet together performed ball passes around body to therapist who varied height; Patient was able to perform without difficulty and no loss of balance. Repeated activity with patient in semitandem stance alternating LE forward;  Object tapping: On AirEx pad, patient performed alternate foot stepping stone tapping in series of one, two and three stepping stones as called out by therapist. Pt required CGA with very infrequent minA+1, Patient intermittently bumped // bar for support for a few seconds;   Quick Turns Ambulation with quick turns first to the L, then R, and then alternating as called out by therapist, no LOB noted during quick turns;  PT Education - 06/25/16 0957    Education provided Yes   Education Details HEP reinforced, education about osteoporosis and strength training   Person(s) Educated Patient   Methods Explanation   Comprehension Verbalized understanding          PT Short Term Goals - 06/12/16 1720      PT SHORT TERM GOAL #1   Title Patient will demonstrate understanding of initial HEP. (Target Date: 07/06/2016)    Time 4   Period Weeks   Status New     PT SHORT TERM GOAL #2   Title Patient will demonstrate ability to perform 4 alternating foot taps on 4 inch step without UE support with min guard to indicate progress in her ability to perform single limb stance and to indicate a decrease in her  risk of falling. (Target Date: 07/06/2016)    Time 4   Period Weeks   Status New     PT SHORT TERM GOAL #3   Title Patient will demonstrate ability to ambulate 300 feet over outdoor surfaces (grass/gravel) including ramp/curbs/stairs (1 rail) Mod I to indicate a decrease in her risk of falling when ambulating in the community. (Target Date: 07/06/2016)   Time 4   Period Weeks   Status New     PT SHORT TERM GOAL #4   Title Pt will participate in gait velocity and FGA assessments with LTG to be set.   Baseline Met 06/12/2016-LTG set   Time 4   Period Weeks   Status Achieved           PT Long Term Goals - 06/12/16 1721      PT LONG TERM GOAL #1   Title Patient will demonstrate independence with balance HEP. (Target Date: 08/05/2016)    Time 8   Period Weeks   Status New     PT LONG TERM GOAL #2   Title Patient will be able to perform SLS bilaterally for >/=5 seconds to indicate a decrease in her risk of falling. (Target Date: 08/05/2016)    Time 8   Period Weeks   Status New     PT LONG TERM GOAL #3   Title SOT will be performed, and goal will be set. (Target Date: 08/05/2016)    Time 8   Period Weeks   Status New     PT LONG TERM GOAL #4   Title Pt will improve safety with gait and demonstrate decreased falls risk with FGA score of =/> 24 (Target Date: 08/05/2016)    Baseline 22/30   Time 8   Status Revised     PT LONG TERM GOAL #5   Title Patient will score >/=75% on the ABC scale. (Target Date: 08/05/2016)    Time 8   Period Weeks   Status New     PT LONG TERM GOAL #6   Title No gait velocity goal required-pt gait velocity [facility]   Baseline 3.56 ft/sec   Status Deferred               Plan - 06/25/16 2778    Clinical Impression Statement Pt demonstrates difficulty with balance on unstable surfaces but particularly with activities that involve turning. She is able to gradually increase her resistance with leg press today. She reports muscle fatigue at the highest  weight today but appears to complete all repetitions with relative ease. Discussed the importance of strength training with respect to bone density as pt reports she has a history of osteoporosis.  Reviewed and performed HEP with patient and provided corrections as needed.   Rehab Potential Good   Clinical Impairments Affecting Rehab Potential DM type 2, HTN, hyperlipidemia, hypothyroid, GERD, diverticulosis, chronic cystitis, anemia, osteopenia, OA, CKD stage 3    PT Frequency 2x / week   PT Duration 8 weeks   PT Treatment/Interventions ADLs/Self Care Home Management;Electrical Stimulation;Neuromuscular re-education;Balance training;Therapeutic exercise;Therapeutic activities;Functional mobility training;Stair training;Gait training;DME Instruction;Patient/family education;Manual techniques;Passive range of motion;Moist Heat;Orthotic Fit/Training;Taping;Vestibular   PT Next Visit Plan balance strategies, foot clearance during gait, compliant surfaces, turning safely, LE strength training   PT Home Exercise Plan tandem walking, SLS, heel and toe walking, walking with turns; sit to stand with pillow under feet   Consulted and Agree with Plan of Care Patient      Patient will benefit from skilled therapeutic intervention in order to improve the following deficits and impairments:  Abnormal gait, Decreased balance, Decreased mobility, Decreased knowledge of use of DME, Decreased strength, Difficulty walking, Postural dysfunction, Pain, Decreased range of motion  Visit Diagnosis: Unsteadiness on feet  Muscle weakness (generalized)     Problem List Patient Active Problem List   Diagnosis Date Noted  . Carpal tunnel syndrome 02/20/2015   Phillips Grout PT, DPT   Adron Geisel 06/25/2016, 4:55 PM  [place] MAIN [facility] SERVICES [address] [place], [place], [address] Phone: [phone]   Fax:  [phone]  Name: BRYNLEA SPINDLER MRN: [id number] Date  of Birth: [birth year]

## 2016-06-26 ENCOUNTER — Ambulatory Visit: Payer: PRIVATE HEALTH INSURANCE

## 2016-06-28 DIAGNOSIS — E1122 Type 2 diabetes mellitus with diabetic chronic kidney disease: Secondary | ICD-10-CM | POA: Diagnosis not present

## 2016-06-28 DIAGNOSIS — I10 Essential (primary) hypertension: Secondary | ICD-10-CM | POA: Diagnosis not present

## 2016-06-28 DIAGNOSIS — E038 Other specified hypothyroidism: Secondary | ICD-10-CM | POA: Diagnosis not present

## 2016-06-28 DIAGNOSIS — Z6823 Body mass index (BMI) 23.0-23.9, adult: Secondary | ICD-10-CM | POA: Diagnosis not present

## 2016-06-28 DIAGNOSIS — R413 Other amnesia: Secondary | ICD-10-CM | POA: Diagnosis not present

## 2016-06-28 DIAGNOSIS — N184 Chronic kidney disease, stage 4 (severe): Secondary | ICD-10-CM | POA: Diagnosis not present

## 2016-06-28 DIAGNOSIS — R197 Diarrhea, unspecified: Secondary | ICD-10-CM | POA: Diagnosis not present

## 2016-06-28 DIAGNOSIS — I129 Hypertensive chronic kidney disease with stage 1 through stage 4 chronic kidney disease, or unspecified chronic kidney disease: Secondary | ICD-10-CM | POA: Diagnosis not present

## 2016-07-09 ENCOUNTER — Ambulatory Visit: Payer: Medicare Other | Attending: Internal Medicine | Admitting: Physical Therapy

## 2016-07-09 ENCOUNTER — Encounter: Payer: Self-pay | Admitting: Physical Therapy

## 2016-07-09 DIAGNOSIS — M6281 Muscle weakness (generalized): Secondary | ICD-10-CM

## 2016-07-09 DIAGNOSIS — R2681 Unsteadiness on feet: Secondary | ICD-10-CM

## 2016-07-09 DIAGNOSIS — Z9181 History of falling: Secondary | ICD-10-CM | POA: Diagnosis not present

## 2016-07-09 NOTE — Therapy (Signed)
[place] MAIN [facility] SERVICES [address] [place], [place], [address] Phone: [phone]   Fax:  [phone]  Physical Therapy Treatment  Patient Details  Name: Rebecca Sellers MRN: [id number] Date of Birth: Jun 29, 1930 Referring Provider: Shon Baton MD    Encounter Date: 07/09/2016      PT End of Session - 07/09/16 0815    Visit Number 5   Number of Visits 17   Date for PT Re-Evaluation 08/05/16   Authorization Type Medicare and G-codes    PT Start Time 0808   PT Stop Time 0855   PT Time Calculation (min) 47 min   Equipment Utilized During Treatment Gait belt   Activity Tolerance Patient tolerated treatment well   Behavior During Therapy [facility] for tasks assessed/performed      Past Medical History:  Diagnosis Date  . Bilateral swelling of feet   . Bladder problem   . Bruises easily   . Carpal tunnel syndrome 02/20/2015   Bilateral  . Diabetes ([place])   . Fatigue   . Hypertension   . Memory loss   . Osteoarthritis   . Osteoporosis   . Reflux   . Thyroid disease     Past Surgical History:  Procedure Laterality Date  . CHOLECYSTECTOMY      There were no vitals filed for this visit.      Subjective Assessment - 07/09/16 0815    Subjective Patient states she went to the beach last week and was not able to do her home exercise program as frequently as she normally has been doing it, but states she tried to stay active and did a lot of walking. Patient states she has no questions or concerns about HEP.   Pertinent History Fall with L shoulder injury, LBP, OA, osteoporosis, type 2 DM, HTN   Limitations Lifting;Standing;Walking;House hold activities   Diagnostic tests X-Rays    Patient Stated Goals improve balance and stability, improve LBP and shoulder pain    Currently in Pain? No/denies   Pain Onset More than a month ago      Therapeutic Exercise:  Biodex Tower:  Patient performed Biodex tower sidestepping with 12.5# 8  reps left SS and 8 reps right SS and 8 reps forward/retro walking. Patient with several LOB requiring CGA to Min A to correct especially with initial attempts at left sidestepping as compared to right sidestepping and no loss of balance with forward/retro stepping. Patient reports leg fatigue with this exercise.   Heel raises: Patient performed 20 reps with 3 second holds bilateral heel raises with bilateral UEs holding // bar for support with verbal cues initially for technique as patient was rocking her trunk forwards/backwards.   Neuromuscular Re-education:   Floor Ladder: Worked on floor ladder with sequence of forward step, sidestepping to right, backward step, sidestepping to right and repeating length of ladder. Then, repeated this sequence except sidestepping to the left. Patient required CGA with this activity.  Worked on floor ladder stepping forward, backward, sidestepping and 180 degree right and left turns as called out by therapist. Patient had difficulty with left turns and required CGA.  Airex balance beam: On Airex balance beam, performed sidestepping Left/right 5' times 6 reps with alternating 180 degree turn at the end of the balance beam with faded UEs support progressing to no support. Patient required CGA/Min A with this activity. Patient demonstrated use of ankle and hip strategies with this activity.  On Airex balance beam tandem walking 5'  times 6 reps with CGA.            PT Education - 07/09/16 0815    Education provided Yes   Education Details Added heel raises to HEP, discussed goals and plan of care   Person(s) Educated Patient   Methods Explanation;Demonstration;Handout   Comprehension Verbalized understanding;Returned demonstration          PT Short Term Goals - 06/12/16 1720      PT SHORT TERM GOAL #1   Title Patient will demonstrate understanding of initial HEP. (Target Date: 07/06/2016)    Time 4   Period Weeks   Status New     PT SHORT TERM  GOAL #2   Title Patient will demonstrate ability to perform 4 alternating foot taps on 4 inch step without UE support with min guard to indicate progress in her ability to perform single limb stance and to indicate a decrease in her risk of falling. (Target Date: 07/06/2016)    Time 4   Period Weeks   Status New     PT SHORT TERM GOAL #3   Title Patient will demonstrate ability to ambulate 300 feet over outdoor surfaces (grass/gravel) including ramp/curbs/stairs (1 rail) Mod I to indicate a decrease in her risk of falling when ambulating in the community. (Target Date: 07/06/2016)   Time 4   Period Weeks   Status New     PT SHORT TERM GOAL #4   Title Pt will participate in gait velocity and FGA assessments with LTG to be set.   Baseline Met 06/12/2016-LTG set   Time 4   Period Weeks   Status Achieved           PT Long Term Goals - 07/09/16 1435      PT LONG TERM GOAL #1   Title Patient will demonstrate independence with balance HEP. (Target Date: 08/05/2016)    Time 8   Period Weeks   Status New     PT LONG TERM GOAL #2   Title Patient will be able to perform SLS bilaterally for >/=5 seconds to indicate a decrease in her risk of falling. (Target Date: 08/05/2016)    Time 8   Period Weeks   Status New     PT LONG TERM GOAL #3   Title SOT will be performed, and goal will be set. (Target Date: 08/05/2016)    Time 8   Period Weeks   Status New     PT LONG TERM GOAL #4   Title Pt will improve safety with gait and demonstrate decreased falls risk with FGA score of =/> 24 (Target Date: 08/05/2016)    Baseline 22/30   Time 8   Status Revised     PT LONG TERM GOAL #5   Title Patient will score >/=75% on the ABC scale. (Target Date: 08/05/2016)    Time 8   Period Weeks   Status New     PT LONG TERM GOAL #6   Title No gait velocity goal required-pt gait velocity [facility]   Baseline 3.56 ft/sec   Status Deferred               Plan - 07/09/16 0816    Clinical Impression Statement  Patient demonstrates difficulty with turning to the left and with sidestepping leading with left leg with Biodex tower strengthening exercises as compared to the right leg. Patient demonstrates difficulty with uneven surfaces and tandem walking, but improved with practice. Patient encouraged to follow-up as indicated  and to perform HEP daily.    Rehab Potential Good   Clinical Impairments Affecting Rehab Potential DM type 2, HTN, hyperlipidemia, hypothyroid, GERD, diverticulosis, chronic cystitis, anemia, osteopenia, OA, CKD stage 3    PT Frequency 2x / week   PT Duration 8 weeks   PT Treatment/Interventions ADLs/Self Care Home Management;Electrical Stimulation;Neuromuscular re-education;Balance training;Therapeutic exercise;Therapeutic activities;Functional mobility training;Stair training;Gait training;DME Instruction;Patient/family education;Manual techniques;Passive range of motion;Moist Heat;Orthotic Fit/Training;Taping;Vestibular   PT Next Visit Plan balance strategies, foot clearance during gait, compliant surfaces, turning safely, LE strength training   PT Home Exercise Plan tandem walking, SLS, heel and toe walking, walking with turns; sit to stand with pillow under feet   Consulted and Agree with Plan of Care Patient      Patient will benefit from skilled therapeutic intervention in order to improve the following deficits and impairments:  Abnormal gait, Decreased balance, Decreased mobility, Decreased knowledge of use of DME, Decreased strength, Difficulty walking, Postural dysfunction, Pain, Decreased range of motion  Visit Diagnosis: Unsteadiness on feet  Muscle weakness (generalized)  History of falling     Problem List Patient Active Problem List   Diagnosis Date Noted  . Carpal tunnel syndrome 02/20/2015    Murphy,Dorriea 07/09/2016, 2:52 PM  [place] MAIN [facility] SERVICES [address] [place], [place], [address] Phone:  [phone]   Fax:  [phone]  Name: JAMIE HAFFORD MRN: [id number] Date of Birth: [birth year]

## 2016-07-10 ENCOUNTER — Ambulatory Visit: Payer: PRIVATE HEALTH INSURANCE | Admitting: Physical Therapy

## 2016-07-12 ENCOUNTER — Ambulatory Visit: Payer: Medicare Other

## 2016-07-12 ENCOUNTER — Ambulatory Visit: Payer: PRIVATE HEALTH INSURANCE | Admitting: Physical Therapy

## 2016-07-12 VITALS — BP 159/71 | HR 86

## 2016-07-12 DIAGNOSIS — R2681 Unsteadiness on feet: Secondary | ICD-10-CM

## 2016-07-12 DIAGNOSIS — M6281 Muscle weakness (generalized): Secondary | ICD-10-CM

## 2016-07-12 DIAGNOSIS — Z9181 History of falling: Secondary | ICD-10-CM | POA: Diagnosis not present

## 2016-07-12 NOTE — Therapy (Signed)
[place] MAIN [facility] SERVICES [address] [place], [place], [address] Phone: [phone]   Fax:  [phone]  Physical Therapy Treatment  Patient Details  Name: Rebecca Sellers MRN: [id number] Date of Birth: 06-28-1930 Referring Provider: Shon Baton MD    Encounter Date: 07/12/2016      PT End of Session - 07/12/16 1324    Visit Number 6   Number of Visits 17   Date for PT Re-Evaluation 08/05/16   Authorization Type Medicare and G-codes    PT Start Time 1310   PT Stop Time 1350   PT Time Calculation (min) 40 min   Equipment Utilized During Treatment Gait belt   Activity Tolerance Patient tolerated treatment well   Behavior During Therapy [facility] for tasks assessed/performed      Past Medical History:  Diagnosis Date  . Bilateral swelling of feet   . Bladder problem   . Bruises easily   . Carpal tunnel syndrome 02/20/2015   Bilateral  . Diabetes ([place])   . Fatigue   . Hypertension   . Memory loss   . Osteoarthritis   . Osteoporosis   . Reflux   . Thyroid disease     Past Surgical History:  Procedure Laterality Date  . CHOLECYSTECTOMY      Vitals:   07/12/16 1309  BP: (!) 159/71  Pulse: 86  SpO2: 98%        Subjective Assessment - 07/12/16 1308    Subjective Pt reports she is doing well. No medication changes and no falls since last appointment. HEP is going well at home. No specific questions or concerns.    Pertinent History Fall with L shoulder injury, LBP, OA, osteoporosis, type 2 DM, HTN   Limitations Lifting;Standing;Walking;House hold activities   Diagnostic tests X-Rays    Patient Stated Goals improve balance and stability, improve LBP and shoulder pain    Currently in Pain? No/denies   Pain Onset --          TREATMENT  Therapeutic Exercise:  Biodex Tower  Patient performed Biodex tower forward, backwards, and sidestepping with 12.5# 3 laps each. Patient with several LOB requiring CGA to Min A to  correct especially with initial attempts at sidestepping. Patient reports leg fatigue with this exercise.   Leg Press Quantum single leg press 60# 2 x 15 bilateral LEs, poor eccentric control initially but improves with cues. ;  Heel raises Patient performed 2 x 20 reps with 3 second holds bilateral heel raises with bilateral UEs holding // bar for support with verbal cues initially for technique;  Abduction Standing red tband resisted hip abduction 2 x 15 bilateral;  BOSU Forward step-up onto BOSU (round side up) alternating LE x 10 each with faded UEs support progressing to no support, CGA to modA+1 for balance;  Neuromuscular Re-education:   Airex balance beam: On Airex balance beam, performed sidestepping left/right 5' times 6 reps with faded UEs support progressing to no support. Patient required CGA/Min A with this activity.; On Airex balance beam tandem walking 5' times 6 reps with CGA, 180 degree turns at end without UE support, CGA;   Rockerboard Worked on dynamic reaching to points on mirror while pt balancing on rockerboard. Board oriented in R/L and A/P directions. Therapist called out UE laterality and card on mirror; CGA to minA+1 support;  Single Leg Balance Kicking tennis ball off top of cone without toppling cone to practice extended dynamic single leg balance x 10 each  side;                       PT Education - 07/12/16 1324    Education provided Yes   Education Details Exercise technique, reinforced HEP   Person(s) Educated Patient   Methods Explanation   Comprehension Verbalized understanding          PT Short Term Goals - 06/12/16 1720      PT SHORT TERM GOAL #1   Title Patient will demonstrate understanding of initial HEP. (Target Date: 07/06/2016)    Time 4   Period Weeks   Status New     PT SHORT TERM GOAL #2   Title Patient will demonstrate ability to perform 4 alternating foot taps on 4 inch step without UE support with min  guard to indicate progress in her ability to perform single limb stance and to indicate a decrease in her risk of falling. (Target Date: 07/06/2016)    Time 4   Period Weeks   Status New     PT SHORT TERM GOAL #3   Title Patient will demonstrate ability to ambulate 300 feet over outdoor surfaces (grass/gravel) including ramp/curbs/stairs (1 rail) Mod I to indicate a decrease in her risk of falling when ambulating in the community. (Target Date: 07/06/2016)   Time 4   Period Weeks   Status New     PT SHORT TERM GOAL #4   Title Pt will participate in gait velocity and FGA assessments with LTG to be set.   Baseline Met 06/12/2016-LTG set   Time 4   Period Weeks   Status Achieved           PT Long Term Goals - 07/09/16 1435      PT LONG TERM GOAL #1   Title Patient will demonstrate independence with balance HEP. (Target Date: 08/05/2016)    Time 8   Period Weeks   Status New     PT LONG TERM GOAL #2   Title Patient will be able to perform SLS bilaterally for >/=5 seconds to indicate a decrease in her risk of falling. (Target Date: 08/05/2016)    Time 8   Period Weeks   Status New     PT LONG TERM GOAL #3   Title SOT will be performed, and goal will be set. (Target Date: 08/05/2016)    Time 8   Period Weeks   Status New     PT LONG TERM GOAL #4   Title Pt will improve safety with gait and demonstrate decreased falls risk with FGA score of =/> 24 (Target Date: 08/05/2016)    Baseline 22/30   Time 8   Status Revised     PT LONG TERM GOAL #5   Title Patient will score >/=75% on the ABC scale. (Target Date: 08/05/2016)    Time 8   Period Weeks   Status New     PT LONG TERM GOAL #6   Title No gait velocity goal required-pt gait velocity WFL   Baseline 3.56 ft/sec   Status Deferred               Plan - 07/12/16 1325    Clinical Impression Statement Pt demonstrates some instability with resisted side stepping again today. She requires cues for good eccentric control during  leg press as well. No pain reported during session. Pt encouraged to continue current HEP and follow-up as scheduled. She is curious about duration of physical therapy. Discussed  repeating outcome measures on tenth visit and then making a plan for therapy moving forward.    Rehab Potential Good   Clinical Impairments Affecting Rehab Potential DM type 2, HTN, hyperlipidemia, hypothyroid, GERD, diverticulosis, chronic cystitis, anemia, osteopenia, OA, CKD stage 3    PT Frequency 2x / week   PT Duration 8 weeks   PT Treatment/Interventions ADLs/Self Care Home Management;Electrical Stimulation;Neuromuscular re-education;Balance training;Therapeutic exercise;Therapeutic activities;Functional mobility training;Stair training;Gait training;DME Instruction;Patient/family education;Manual techniques;Passive range of motion;Moist Heat;Orthotic Fit/Training;Taping;Vestibular   PT Next Visit Plan balance strategies, foot clearance during gait, compliant surfaces, turning safely, LE strength training   PT Home Exercise Plan tandem walking, SLS, heel and toe walking, walking with turns; sit to stand with pillow under feet   Consulted and Agree with Plan of Care Patient      Patient will benefit from skilled therapeutic intervention in order to improve the following deficits and impairments:  Abnormal gait, Decreased balance, Decreased mobility, Decreased knowledge of use of DME, Decreased strength, Difficulty walking, Postural dysfunction, Pain, Decreased range of motion  Visit Diagnosis: Unsteadiness on feet  Muscle weakness (generalized)     Problem List Patient Active Problem List   Diagnosis Date Noted  . Carpal tunnel syndrome 02/20/2015   Phillips Grout PT, DPT   Jerral Mccauley 07/12/2016, 4:42 PM  [place] MAIN [facility] SERVICES [address] [place], [place], [address] Phone: [phone]   Fax:  [phone]  Name: Rebecca Sellers MRN: [id number] Date  of Birth: 05-04-1930

## 2016-07-16 ENCOUNTER — Ambulatory Visit: Payer: Medicare Other | Admitting: Physical Therapy

## 2016-07-16 ENCOUNTER — Encounter: Payer: Self-pay | Admitting: Physical Therapy

## 2016-07-16 DIAGNOSIS — Z9181 History of falling: Secondary | ICD-10-CM | POA: Diagnosis not present

## 2016-07-16 DIAGNOSIS — M6281 Muscle weakness (generalized): Secondary | ICD-10-CM

## 2016-07-16 DIAGNOSIS — R2681 Unsteadiness on feet: Secondary | ICD-10-CM | POA: Diagnosis not present

## 2016-07-16 NOTE — Therapy (Addendum)
[place] [facility] REHAB SERVICES [address] [place], [place], [address] Phone: [phone]   Fax:  [phone]  Physical Therapy Treatment  Patient Details  Name: Rebecca Sellers MRN: [id number] Date of Birth: 08/27/1930 Referring Provider: John Russo MD    Encounter Date: 07/16/2016      PT End of Session - 07/16/16 0909    Visit Number 7   Number of Visits 17   Date for PT Re-Evaluation 08/05/16   Authorization Type Medicare and G-codes    PT Start Time 0906   PT Stop Time 0956   PT Time Calculation (min) 50 min   Equipment Utilized During Treatment Gait belt   Activity Tolerance Patient tolerated treatment well   Behavior During Therapy [facility] for tasks assessed/performed      Past Medical History:  Diagnosis Date  . Bilateral swelling of feet   . Bladder problem   . Bruises easily   . Carpal tunnel syndrome 02/20/2015   Bilateral  . Diabetes ([facility])   . Fatigue   . Hypertension   . Memory loss   . Osteoarthritis   . Osteoporosis   . Reflux   . Thyroid disease     Past Surgical History:  Procedure Laterality Date  . CHOLECYSTECTOMY      There were no vitals filed for this visit.      Subjective Assessment - 07/16/16 0908    Subjective Patient states she feels she is doing better. Patient states she feels more steady on her feet. Patient reports that she has been consistent with her HEP.    Pertinent History Fall with L shoulder injury, LBP, OA, osteoporosis, type 2 DM, HTN   Limitations Lifting;Standing;Walking;House hold activities   Diagnostic tests X-Rays    Patient Stated Goals improve balance and stability, improve LBP and shoulder pain    Currently in Pain? --  stiffness due to the weather in joints from arthritis per pt report     Neuromuscular Re-education:  Red foam floor mat activity: Worked on firm surface and then on red floor foam mat kicking ball against wall with alternating kicking foot.  Patient  able to perform with CGA.  Worked on sit to stand on red foam floor mat while carrying varying size weighted balls. Patient would transfer carrying a single ball and then turn to the left to place ball into bin and then do left turn to return to sitting position. Repeated activity with patient doing alternating left and right turns to place ball into bins. Patient did well with this activity and was able to perform sit to stand without using UEs to assist for balance and patient able to perform without any losses of balance with turning with CGA.    Therapeutic Exercise:  Biodex Tower:  Patient performed Biodex tower forward, backwards, and sidestepping with 15.0 # 8 laps forward and 6-8 laps each sidestepping. Patient with several LOB requiring CGA to Min A to correctespecially with initial attempts and with left sidestepping. Patient reports leg fatigue with this exercise. Patient required verbal cues initially to move slowly and in a controlled fashion with backwards walking.   Leg Press: Quantum single leg press 60# 2 x 15 left LE, with verbal cues for speed to do slow eccentric movement.   Heel & Toe Walking: Patient performed toe and then heel walking 6' times 4 reps without UEs support. Patient able to perform while maintaining appropriate foot position the 6' length each time. Patient   with slow turning and mild imbalance when attempting to turn while doing heel and toe walking.        PT Education - 07/16/16 0909    Education provided Yes   Education Details Discussed plan of care   Person(s) Educated Patient   Methods Explanation   Comprehension Verbalized understanding          PT Short Term Goals - 06/12/16 1720      PT SHORT TERM GOAL #1   Title Patient will demonstrate understanding of initial HEP. (Target Date: 07/06/2016)    Time 4   Period Weeks   Status New     PT SHORT TERM GOAL #2   Title Patient will demonstrate ability to perform 4 alternating foot taps on  4 inch step without UE support with min guard to indicate progress in her ability to perform single limb stance and to indicate a decrease in her risk of falling. (Target Date: 07/06/2016)    Time 4   Period Weeks   Status New     PT SHORT TERM GOAL #3   Title Patient will demonstrate ability to ambulate 300 feet over outdoor surfaces (grass/gravel) including ramp/curbs/stairs (1 rail) Mod I to indicate a decrease in her risk of falling when ambulating in the community. (Target Date: 07/06/2016)   Time 4   Period Weeks   Status New     PT SHORT TERM GOAL #4   Title Pt will participate in gait velocity and FGA assessments with LTG to be set.   Baseline Met 06/12/2016-LTG set   Time 4   Period Weeks   Status Achieved           PT Long Term Goals - 07/16/16 1120      PT LONG TERM GOAL #1   Title Patient will demonstrate independence with balance HEP. (Target Date: 08/05/2016)    Time 8   Period Weeks   Status Achieved     PT LONG TERM GOAL #2   Title Patient will be able to perform SLS bilaterally for >/=5 seconds to indicate a decrease in her risk of falling. (Target Date: 08/05/2016)    Time 8   Period Weeks   Status On-going     PT LONG TERM GOAL #3   Title SOT will be performed, and goal will be set. (Target Date: 08/05/2016)    Time 8   Period Weeks   Status Deferred     PT LONG TERM GOAL #4   Title Pt will improve safety with gait and demonstrate decreased falls risk with FGA score of =/> 24 (Target Date: 08/05/2016)    Baseline 22/30   Time 8   Status On-going     PT LONG TERM GOAL #5   Title Patient will score >/=75% on the ABC scale. (Target Date: 08/05/2016)    Baseline scored 56% on 07/12/2016   Time 8   Period Weeks   Status On-going     PT LONG TERM GOAL #6   Title No gait velocity goal required-pt gait velocity WFL   Baseline 3.56 ft/sec   Status Deferred               Plan - 07/16/16 0909    Clinical Impression Statement Patient did well with sit to  stand while carrying weighted ball while standing on red foam mat surface with alternating right and left turns this date. Patient demonstrates improvement in toe raise exercise and was able to do both  heel and toe walking without UEs support. Patient able to increase weight with resisted sidestepping and forward walking but continues to demonstrate instability/imbalance with this activity and especially struggles with left side stepping.  [place] would benefit from further PT services to address balance and strength deficits and in order to help achieve goals as set on plan of care.    Rehab Potential Good   Clinical Impairments Affecting Rehab Potential DM type 2, HTN, hyperlipidemia, hypothyroid, GERD, diverticulosis, chronic cystitis, anemia, osteopenia, OA, CKD stage 3    PT Frequency 2x / week   PT Duration 8 weeks   PT Treatment/Interventions ADLs/Self Care Home Management;Electrical Stimulation;Neuromuscular re-education;Balance training;Therapeutic exercise;Therapeutic activities;Functional mobility training;Stair training;Gait training;DME Instruction;Patient/family education;Manual techniques;Passive range of motion;Moist Heat;Orthotic Fit/Training;Taping;Vestibular   PT Next Visit Plan try balance obstacle course, Biodex tower, lateral sidestepping with mini squats with holding weighted ball   PT Home Exercise Plan tandem walking, SLS, heel and toe walking, walking with turns; sit to stand with pillow under feet   Consulted and Agree with Plan of Care Patient      Patient will benefit from skilled therapeutic intervention in order to improve the following deficits and impairments:  Abnormal gait, Decreased balance, Decreased mobility, Decreased knowledge of use of DME, Decreased strength, Difficulty walking, Postural dysfunction, Pain, Decreased range of motion  Visit Diagnosis: Unsteadiness on feet  Muscle weakness (generalized)  History of falling     Problem List Patient  Active Problem List   Diagnosis Date Noted  . Carpal tunnel syndrome 02/20/2015   Lady Deutscher PT, DPT [phone] Lady Deutscher 07/16/2016, 11:21 AM  [place] MAIN [facility] SERVICES [address] [place], [place], [address] Phone: [phone]   Fax:  [phone]  Name: Rebecca Sellers MRN: [id number] Date of Birth: 1930/09/26

## 2016-07-17 ENCOUNTER — Ambulatory Visit: Payer: PRIVATE HEALTH INSURANCE | Admitting: Physical Therapy

## 2016-07-19 ENCOUNTER — Encounter: Payer: Self-pay | Admitting: Physical Therapy

## 2016-07-19 ENCOUNTER — Ambulatory Visit: Payer: PRIVATE HEALTH INSURANCE | Admitting: Physical Therapy

## 2016-07-19 ENCOUNTER — Ambulatory Visit: Payer: Medicare Other | Admitting: Physical Therapy

## 2016-07-19 DIAGNOSIS — R2681 Unsteadiness on feet: Secondary | ICD-10-CM

## 2016-07-19 DIAGNOSIS — M6281 Muscle weakness (generalized): Secondary | ICD-10-CM

## 2016-07-19 DIAGNOSIS — Z9181 History of falling: Secondary | ICD-10-CM | POA: Diagnosis not present

## 2016-07-19 NOTE — Therapy (Signed)
[place] MAIN [facility] SERVICES [address] [place], [place], [address] Phone: [phone]   Fax:  [phone]  Physical Therapy Treatment  Patient Details  Name: Rebecca Sellers MRN: [id number] Date of Birth: February 14, 1930 Referring Provider: Shon Baton MD    Encounter Date: 07/19/2016      PT End of Session - 07/19/16 0907    Visit Number 8   Number of Visits 17   Date for PT Re-Evaluation 08/05/16   Authorization Type Medicare and G-codes    PT Start Time 0902   PT Stop Time 0952   PT Time Calculation (min) 50 min   Equipment Utilized During Treatment Gait belt   Activity Tolerance Patient tolerated treatment well   Behavior During Therapy [facility] for tasks assessed/performed      Past Medical History:  Diagnosis Date  . Bilateral swelling of feet   . Bladder problem   . Bruises easily   . Carpal tunnel syndrome 02/20/2015   Bilateral  . Diabetes ([place])   . Fatigue   . Hypertension   . Memory loss   . Osteoarthritis   . Osteoporosis   . Reflux   . Thyroid disease     Past Surgical History:  Procedure Laterality Date  . CHOLECYSTECTOMY      There were no vitals filed for this visit.      Subjective Assessment - 07/19/16 0906    Subjective Patient states that she continues to feel better and reports no falls.    Pertinent History Fall with L shoulder injury, LBP, OA, osteoporosis, type 2 DM, HTN   Limitations Lifting;Standing;Walking;House hold activities   Diagnostic tests X-Rays    Patient Stated Goals improve balance and stability, improve LBP and shoulder pain    Currently in Pain? --  reports generalized joint pain chronic      Neuromuscular Re-education:  Obstacle Course: Patient performed obstacle course multiple reps which included the following activities: navigating over balance pods,onto and off 1/2 foam rolls, navigating between cones, stepping onto and over 5" wooden step and stepping onto and over foam  Airex pads. Patient did well navigating on the course and was most challenged by the balance pods and navigating sharp turns between cones. Patient required CGA with this activity.  Therapeutic Exercise:  Biodex Tower: Performed Biodex weight tower machine walking forward and sidestepping to the left and to the right 8 reps with #15.0 pounds. Patient improved with left sidestepping this date able to tolerate increased reps and with fewer losses of balance. Patient with short, seated rest break after this activity.   Theraband Exercises: Patient performed in sitting with green Theraband, seated hip abduction exercises 10 reps left and right leg with verbal cuing for technique and to perform slowly and controlled. Issued for home exercise program. Reviewed with patient and handout provided.   In // bars, patient performed sidestepping in mini-squat position while sidestepping left and right with green Theraband around thighs 4 reps of 6' each with patient holding // bar for support. Patient required verbal cuing to maintain upright trunk posture and to maintain mini-squat position while sidestepping. Patient reports fatigue with this activity.         PT Education - 07/19/16 0907    Education provided Yes   Education Details hip abduction with green Tband exercise given for HEP   Person(s) Educated Patient   Methods Explanation;Handout;Demonstration;Verbal cues   Comprehension Verbalized understanding;Returned demonstration  PT Short Term Goals - 06/12/16 1720      PT SHORT TERM GOAL #1   Title Patient will demonstrate understanding of initial HEP. (Target Date: 07/06/2016)    Time 4   Period Weeks   Status New     PT SHORT TERM GOAL #2   Title Patient will demonstrate ability to perform 4 alternating foot taps on 4 inch step without UE support with min guard to indicate progress in her ability to perform single limb stance and to indicate a decrease in her risk of falling.  (Target Date: 07/06/2016)    Time 4   Period Weeks   Status New     PT SHORT TERM GOAL #3   Title Patient will demonstrate ability to ambulate 300 feet over outdoor surfaces (grass/gravel) including ramp/curbs/stairs (1 rail) Mod I to indicate a decrease in her risk of falling when ambulating in the community. (Target Date: 07/06/2016)   Time 4   Period Weeks   Status New     PT SHORT TERM GOAL #4   Title Pt will participate in gait velocity and FGA assessments with LTG to be set.   Baseline Met 06/12/2016-LTG set   Time 4   Period Weeks   Status Achieved           PT Long Term Goals - 07/16/16 1120      PT LONG TERM GOAL #1   Title Patient will demonstrate independence with balance HEP. (Target Date: 08/05/2016)    Time 8   Period Weeks   Status Achieved     PT LONG TERM GOAL #2   Title Patient will be able to perform SLS bilaterally for >/=5 seconds to indicate a decrease in her risk of falling. (Target Date: 08/05/2016)    Time 8   Period Weeks   Status On-going     PT LONG TERM GOAL #3   Title SOT will be performed, and goal will be set. (Target Date: 08/05/2016)    Time 8   Period Weeks   Status Deferred     PT LONG TERM GOAL #4   Title Pt will improve safety with gait and demonstrate decreased falls risk with FGA score of =/> 24 (Target Date: 08/05/2016)    Baseline 22/30   Time 8   Status On-going     PT LONG TERM GOAL #5   Title Patient will score >/=75% on the ABC scale. (Target Date: 08/05/2016)    Baseline scored 56% on 07/12/2016   Time 8   Period Weeks   Status On-going     PT LONG TERM GOAL #6   Title No gait velocity goal required-pt gait velocity [facility]   Baseline 3.56 ft/sec   Status Deferred               Plan - 07/19/16 0908    Clinical Impression Statement Patient did well with the obstacle course activity this date. Patient able to increase reps with Biodex resisted sidestepping but does continue to demonstrate instability at times with this  activity requiring CGA. Advanced home exercise program this date to work on hip abduction strengthening. Encouarged patient to continue to perform HEP and to follow up as indicated. Will plan on retesting fucntional outcome measures in the next 1-2 visits.    Rehab Potential Good   Clinical Impairments Affecting Rehab Potential DM type 2, HTN, hyperlipidemia, hypothyroid, GERD, diverticulosis, chronic cystitis, anemia, osteopenia, OA, CKD stage 3    PT Frequency 2x /  week   PT Duration 8 weeks   PT Treatment/Interventions ADLs/Self Care Home Management;Electrical Stimulation;Neuromuscular re-education;Balance training;Therapeutic exercise;Therapeutic activities;Functional mobility training;Stair training;Gait training;DME Instruction;Patient/family education;Manual techniques;Passive range of motion;Moist Heat;Orthotic Fit/Training;Taping;Vestibular   PT Next Visit Plan Biodex tower, lateral sidestepping with mini squats; consider retesting functional outcome measures in 1 or 2 sessions.    PT Home Exercise Plan tandem walking, SLS, heel and toe walking, walking with turns; sit to stand with pillow under feet   Consulted and Agree with Plan of Care Patient      Patient will benefit from skilled therapeutic intervention in order to improve the following deficits and impairments:  Abnormal gait, Decreased balance, Decreased mobility, Decreased knowledge of use of DME, Decreased strength, Difficulty walking, Postural dysfunction, Pain, Decreased range of motion  Visit Diagnosis: Unsteadiness on feet  Muscle weakness (generalized)  History of falling     Problem List Patient Active Problem List   Diagnosis Date Noted  . Carpal tunnel syndrome 02/20/2015   Lady Deutscher PT, DPT [phone] Lady Deutscher 07/19/2016, 12:19 PM  [place] MAIN [facility] SERVICES [address] [place], [place], [address] Phone: [phone]   Fax:  [phone]  Name: Rebecca Sellers MRN: [id number] Date of Birth: 09/17/1930

## 2016-07-23 ENCOUNTER — Ambulatory Visit: Payer: Medicare Other | Admitting: Physical Therapy

## 2016-07-23 ENCOUNTER — Encounter: Payer: Self-pay | Admitting: Physical Therapy

## 2016-07-23 DIAGNOSIS — M6281 Muscle weakness (generalized): Secondary | ICD-10-CM | POA: Diagnosis not present

## 2016-07-23 DIAGNOSIS — R2681 Unsteadiness on feet: Secondary | ICD-10-CM | POA: Diagnosis not present

## 2016-07-23 DIAGNOSIS — Z9181 History of falling: Secondary | ICD-10-CM | POA: Diagnosis not present

## 2016-07-23 NOTE — Therapy (Signed)
[place] MAIN [facility] SERVICES [address] [place], [place], [address] Phone: [phone]   Fax:  [phone]  Physical Therapy Treatment  Patient Details  Name: Rebecca Sellers MRN: [id number] Date of Birth: 03-10-1930 Referring Provider: Shon Baton MD    Encounter Date: 07/23/2016      PT End of Session - 07/23/16 0812    Visit Number 9   Number of Visits 17   Date for PT Re-Evaluation 08/05/16   Authorization Type Medicare and G-codes    PT Start Time 0812   PT Stop Time 0900   PT Time Calculation (min) 48 min   Equipment Utilized During Treatment Gait belt   Activity Tolerance Patient tolerated treatment well   Behavior During Therapy [facility] for tasks assessed/performed      Past Medical History:  Diagnosis Date  . Bilateral swelling of feet   . Bladder problem   . Bruises easily   . Carpal tunnel syndrome 02/20/2015   Bilateral  . Diabetes ([place])   . Fatigue   . Hypertension   . Memory loss   . Osteoarthritis   . Osteoporosis   . Reflux   . Thyroid disease     Past Surgical History:  Procedure Laterality Date  . CHOLECYSTECTOMY      There were no vitals filed for this visit.      Subjective Assessment - 07/23/16 0811    Subjective Patient states she is doing good and states she has been doing her exercises and has been doing the Theraband exercise.    Pertinent History Fall with L shoulder injury, LBP, OA, osteoporosis, type 2 DM, HTN   Limitations Lifting;Standing;Walking;House hold activities   Diagnostic tests X-Rays    Patient Stated Goals improve balance and stability, improve LBP and shoulder pain    Currently in Pain? --  none stated      Therapeutic Exercise:  Biodex Tower: Performed Biodex weight tower machine walking forward and sidestepping to the left and to the right 8 reps with #15.0 pounds. Patient had one small loss of balance with left sidestepping requiring min A to correct.   Neuromuscular  Re-education:  Diona Foley circles: Worked on standing with one foot on ball while doing clockwise rolls multiple reps each foot without UEs support. Performed multiple sets of this exercise. Patient with occasional loss of balance with assistance ranging from CGA to Min A to correct loss of balance.   Airex pad: On firm surface and then on Airex pad, patient performed semi-tandem progressions with alternating lead leg with and without horizontal head turns.  Worked on feet together with eyes open and eyes closed 30-60 second trials. Patient with increased sway with eyes closed, but able to perform for 30 second trials using ankle and hip strategies as well as patient with hands in low guard position abducted from side and at about waist level.   Cone tapping: On firm surface, patient performed alternate balance pod foot tapping in series of one, two and then three targets as called out by therapist. Patient progressed from one hand support to no UEs support with patient holding her arms in low guard position with elbows out from body and hands up near waist. Patient requiring assistance ranging from CGA to Min A at times due to loss of balance. Practiced this activity standing in corner with chair in front for safety when performing at home for home exercise program.         PT  Education - 07/23/16 [phone]    Education provided Yes   Education Details phamplet from [facility] on home safety checklist and falls prevention issued to patient: issued tandem stance progressions and single leg stance and alternating support leg SLS while tapping to targets exercises for HEP; discussed Silver Sneakers program; discussed safety with performing HEP including how to stand in corner with chair in front   Person(s) Educated Patient   Methods Explanation;Handout;Demonstration   Comprehension Verbalized understanding;Returned demonstration          PT Short Term Goals - 06/12/16 1720      PT SHORT TERM GOAL #1    Title Patient will demonstrate understanding of initial HEP. (Target Date: 07/06/2016)    Time 4   Period Weeks   Status New     PT SHORT TERM GOAL #2   Title Patient will demonstrate ability to perform 4 alternating foot taps on 4 inch step without UE support with min guard to indicate progress in her ability to perform single limb stance and to indicate a decrease in her risk of falling. (Target Date: 07/06/2016)    Time 4   Period Weeks   Status New     PT SHORT TERM GOAL #3   Title Patient will demonstrate ability to ambulate 300 feet over outdoor surfaces (grass/gravel) including ramp/curbs/stairs (1 rail) Mod I to indicate a decrease in her risk of falling when ambulating in the community. (Target Date: 07/06/2016)   Time 4   Period Weeks   Status New     PT SHORT TERM GOAL #4   Title Pt will participate in gait velocity and FGA assessments with LTG to be set.   Baseline Met 06/12/2016-LTG set   Time 4   Period Weeks   Status Achieved           PT Long Term Goals - 07/16/16 1120      PT LONG TERM GOAL #1   Title Patient will demonstrate independence with balance HEP. (Target Date: 08/05/2016)    Time 8   Period Weeks   Status Achieved     PT LONG TERM GOAL #2   Title Patient will be able to perform SLS bilaterally for >/=5 seconds to indicate a decrease in her risk of falling. (Target Date: 08/05/2016)    Time 8   Period Weeks   Status On-going     PT LONG TERM GOAL #3   Title SOT will be performed, and goal will be set. (Target Date: 08/05/2016)    Time 8   Period Weeks   Status Deferred     PT LONG TERM GOAL #4   Title Pt will improve safety with gait and demonstrate decreased falls risk with FGA score of =/> 24 (Target Date: 08/05/2016)    Baseline 22/30   Time 8   Status On-going     PT LONG TERM GOAL #5   Title Patient will score >/=75% on the ABC scale. (Target Date: 08/05/2016)    Baseline scored 56% on 07/12/2016   Time 8   Period Weeks   Status On-going     PT  LONG TERM GOAL #6   Title No gait velocity goal required-pt gait velocity WFL   Baseline 3.56 ft/sec   Status Deferred               Plan - 07/23/16 7867    Clinical Impression Statement Patient challenged by single leg stance activities and tandem stance progressions on Airex pad.  Added additional exercises to home exercise program. Discussed plan to re-test functional outcome measures next session and began to discuss Silver Sneakers and community exercise programs.    Rehab Potential Good   Clinical Impairments Affecting Rehab Potential DM type 2, HTN, hyperlipidemia, hypothyroid, GERD, diverticulosis, chronic cystitis, anemia, osteopenia, OA, CKD stage 3    PT Frequency 2x / week   PT Duration 8 weeks   PT Treatment/Interventions ADLs/Self Care Home Management;Electrical Stimulation;Neuromuscular re-education;Balance training;Therapeutic exercise;Therapeutic activities;Functional mobility training;Stair training;Gait training;DME Instruction;Patient/family education;Manual techniques;Passive range of motion;Moist Heat;Orthotic Fit/Training;Taping;Vestibular   PT Next Visit Plan Biodex tower, lateral sidestepping with mini squats; consider retesting functional outcome measures next session.    PT Home Exercise Plan tandem walking, SLS, heel and toe walking, walking with turns; sit to stand with pillow under feet   Consulted and Agree with Plan of Care Patient      Patient will benefit from skilled therapeutic intervention in order to improve the following deficits and impairments:  Abnormal gait, Decreased balance, Decreased mobility, Decreased knowledge of use of DME, Decreased strength, Difficulty walking, Postural dysfunction, Pain, Decreased range of motion  Visit Diagnosis: Unsteadiness on feet  Muscle weakness (generalized)  History of falling     Problem List Patient Active Problem List   Diagnosis Date Noted  . Carpal tunnel syndrome 02/20/2015   Lady Deutscher  PT, DPT [phone] Lady Deutscher 07/23/2016, 10:08 AM  [place] [facility] SERVICES [address] [place], [place], [address] Phone: [phone]   Fax:  [phone]  Name: Rebecca Sellers MRN: [id number] Date of Birth: 08/21/1930

## 2016-07-24 ENCOUNTER — Ambulatory Visit: Payer: PRIVATE HEALTH INSURANCE | Admitting: Physical Therapy

## 2016-07-26 ENCOUNTER — Ambulatory Visit: Payer: PRIVATE HEALTH INSURANCE | Admitting: Physical Therapy

## 2016-07-30 ENCOUNTER — Encounter: Payer: Self-pay | Admitting: Physical Therapy

## 2016-07-30 ENCOUNTER — Ambulatory Visit: Payer: Medicare Other | Admitting: Physical Therapy

## 2016-07-30 DIAGNOSIS — Z9181 History of falling: Secondary | ICD-10-CM | POA: Diagnosis not present

## 2016-07-30 DIAGNOSIS — M6281 Muscle weakness (generalized): Secondary | ICD-10-CM

## 2016-07-30 DIAGNOSIS — R2681 Unsteadiness on feet: Secondary | ICD-10-CM

## 2016-07-30 NOTE — Therapy (Signed)
[place] [facility] SERVICES [address] [place], [place], [address] Phone: [phone]   Fax:  [phone]  Physical Therapy Treatment/ Discharge Summary  Patient Details  Name: Rebecca Sellers MRN: [id number] Date of Birth: April 18, 1930 Referring Provider: Shon Baton MD    Encounter Date: 07/30/2016      PT End of Session - 07/30/16 1018    Visit Number 10   Number of Visits 17   Date for PT Re-Evaluation 08/05/16   Authorization Type Medicare and G-codes    PT Start Time 04/09/12   PT Stop Time 1100   PT Time Calculation (min) 46 min   Equipment Utilized During Treatment Gait belt   Activity Tolerance Patient tolerated treatment well   Behavior During Therapy [facility] for tasks assessed/performed      Past Medical History:  Diagnosis Date  . Bilateral swelling of feet   . Bladder problem   . Bruises easily   . Carpal tunnel syndrome 02/20/2015   Bilateral  . Diabetes ([place])   . Fatigue   . Hypertension   . Memory loss   . Osteoarthritis   . Osteoporosis   . Reflux   . Thyroid disease     Past Surgical History:  Procedure Laterality Date  . CHOLECYSTECTOMY      There were no vitals filed for this visit.      Subjective Assessment - 07/30/16 1018    Subjective Patient reports she feels she is doing better. She states she has been doing the Theraband exercises and that her legs get a little sore from the exercise. Patient has no questions or concerns in regards to her home exercise program.    Pertinent History Fall with L shoulder injury, LBP, OA, osteoporosis, type 2 DM, HTN   Limitations Lifting;Standing;Walking;House hold activities   Diagnostic tests X-Rays    Patient Stated Goals improve balance and stability, improve LBP and shoulder pain    Currently in Pain? No/denies           [facility] PT Assessment - 07/30/16 0001      Standardized Balance Assessment   Standardized Balance Assessment Dynamic Gait Index     Dynamic Gait Index   Level Surface Normal   Change in Gait Speed Normal   Gait with Horizontal Head Turns Normal   Gait with Vertical Head Turns Normal   Gait and Pivot Turn Normal   Step Over Obstacle Normal   Step Around Obstacles Normal   Steps Mild Impairment   Total Score 23     Functional Gait  Assessment   Gait assessed  Yes   Gait Level Surface Walks 20 ft in less than 5.5 sec, no assistive devices, good speed, no evidence for imbalance, normal gait pattern, deviates no more than 6 in outside of the 12 in walkway width.   Change in Gait Speed Able to smoothly change walking speed without loss of balance or gait deviation. Deviate no more than 6 in outside of the 12 in walkway width.   Gait with Horizontal Head Turns Performs head turns smoothly with no change in gait. Deviates no more than 6 in outside 12 in walkway width   Gait with Vertical Head Turns Performs head turns with no change in gait. Deviates no more than 6 in outside 12 in walkway width.   Gait and Pivot Turn Pivot turns safely within 3 sec and stops quickly with no loss of balance.   Step Over Obstacle Is  able to step over 2 stacked shoe boxes taped together (9 in total height) without changing gait speed. No evidence of imbalance.   Gait with Narrow Base of Support Ambulates 4-7 steps.   Gait with Eyes Closed Walks 20 ft, uses assistive device, slower speed, mild gait deviations, deviates 6-10 in outside 12 in walkway width. Ambulates 20 ft in less than 9 sec but greater than 7 sec.   Ambulating Backwards Walks 20 ft, uses assistive device, slower speed, mild gait deviations, deviates 6-10 in outside 12 in walkway width.   Steps Alternating feet, must use rail.   Total Score 25   FGA comment: 25/30      Neuromuscular Re-education: Patient performed Functional Gait Assessment test. Patient ambulated greater than 1000' on level and ramped surfaces without AD without LOB or unsteadiness independently with good  cadence and reciprocal arm swing with step through gait pattern.  Patient performed SLS trials and ranged from 2-4 seconds with Left SLS and 3-5 seconds with Right SLS.  Patient performed alternating step taps on 6" wooden step without UEs support without LOB with supervision.  Patient educated on [place] and [employer] and information provided. Discussed community exercise programs including [place], [employer] and [employer].  Discussed discharge plans, functional outcome testing results and progress towards goals.         PT Education - 07/30/16 1018    Education provided Yes   Education Details Discussed functional outcome measure testing and progress towards goals. Discussed discharge plans and community based exercise programs including [employer] as well as [employer] and [employer] with [employer]. Handout provided with information on Scientist, research (medical) [employer].    Person(s) Educated Patient   Methods Explanation;Handout   Comprehension Verbalized understanding          PT Short Term Goals - 07/30/16 1020      PT SHORT TERM GOAL #1   Title Patient will demonstrate understanding of initial HEP. (Target Date: 07/06/2016)    Time 4   Period Weeks   Status Achieved     PT SHORT TERM GOAL #2   Title Patient will demonstrate ability to perform 4 alternating foot taps on 4 inch step without UE support with min guard to indicate progress in her ability to perform single limb stance and to indicate a decrease in her risk of falling. (Target Date: 07/06/2016)    Time 4   Period Weeks   Status Achieved     PT SHORT TERM GOAL #3   Title Patient will demonstrate ability to ambulate 300 feet over outdoor surfaces (grass/gravel) including ramp/curbs/stairs (1 rail) Mod I to indicate a decrease in her risk of falling when ambulating in the community. (Target Date: 07/06/2016)   Time 4   Period Weeks   Status Achieved     PT SHORT TERM GOAL #4    Title Pt will participate in gait velocity and FGA assessments with LTG to be set.   Baseline Met 06/12/2016-LTG set   Time 4   Period Weeks   Status Achieved           PT Long Term Goals - 07/30/16 1020      PT LONG TERM GOAL #1   Title Patient will demonstrate independence with balance HEP. (Target Date: 08/05/2016)    Time 8   Period Weeks   Status Achieved     PT LONG TERM GOAL #2   Title Patient will  be able to perform SLS bilaterally for >/=5 seconds to indicate a decrease in her risk of falling. (Target Date: 08/05/2016)    Time 8   Period Weeks   Status Partially Met     PT LONG TERM GOAL #3   Title SOT will be performed, and goal will be set. (Target Date: 08/05/2016)    Time 8   Period Weeks   Status Deferred     PT LONG TERM GOAL #4   Title Pt will improve safety with gait and demonstrate decreased falls risk with FGA score of =/> 24 (Target Date: 08/05/2016)    Baseline 22/30; scored 25/30 on Aug 29, 2016   Time 8   Status Achieved     PT LONG TERM GOAL #5   Title Patient will score >/=75% on the ABC scale. (Target Date: 08/05/2016)    Baseline scored 56% on 07/12/2016; scored 84.3% on 08/29/16   Time 8   Period Weeks   Status Achieved     PT LONG TERM GOAL #6   Title No gait velocity goal required-pt gait velocity WFL   Baseline 3.56 ft/sec   Status Deferred               Plan - Aug 29, 2016 1018    Clinical Impression Statement Patient reports that she feels she is doing better. Repeated functional outcome measures this date and updated goals. Patient has met 4/4 short term goals and 3/4 long term goals and partially met remaining long term goal. Patient improved from 57% to 84% on the ABC scale and improved from 22/30 to 25/30 on the Functional Gait assessment. Patient scored 23/24 on the DGI. Patient provided information on community exercise programs such as [employer], advertising. Patient is independent with home exercise program and plans on  continuing to do HEP upon dishcarge. Patient is considering also participating in a community exercise program. Patient in agreement with discharge from PT services at this time.    Rehab Potential Good   Clinical Impairments Affecting Rehab Potential DM type 2, HTN, hyperlipidemia, hypothyroid, GERD, diverticulosis, chronic cystitis, anemia, osteopenia, OA, CKD stage 3    PT Frequency 2x / week   PT Duration 8 weeks   PT Treatment/Interventions ADLs/Self Care Home Management;Electrical Stimulation;Neuromuscular re-education;Balance training;Therapeutic exercise;Therapeutic activities;Functional mobility training;Stair training;Gait training;DME Instruction;Patient/family education;Manual techniques;Passive range of motion;Moist Heat;Orthotic Fit/Training;Taping;Vestibular   PT Next Visit Plan --   PT Home Exercise Plan tandem walking, SLS, heel and toe walking, walking with turns; sit to stand with pillow under feet; slow marching; green Theraband hip abduction exercise; standing in corner with chair in front for safety while doing step taps to targets.   Consulted and Agree with Plan of Care Patient      Patient will benefit from skilled therapeutic intervention in order to improve the following deficits and impairments:  Abnormal gait, Decreased balance, Decreased mobility, Decreased knowledge of use of DME, Decreased strength, Difficulty walking, Postural dysfunction, Pain, Decreased range of motion  Visit Diagnosis: Unsteadiness on feet  Muscle weakness (generalized)  History of falling       G-Codes - Aug 29, 2016 05-12-06    Functional Assessment Tool Used (Outpatient Only) ABC score, clinical judgment, DGI, Functional gait assessment   Functional Limitation Mobility: Walking and moving around   Mobility: Walking and Moving Around Goal Status [phone]) At least 1 percent but less than 20 percent impaired, limited or restricted   Mobility: Walking and Moving Around Discharge Status [phone]) At  least 1 percent  but less than 20 percent impaired, limited or restricted      Problem List Patient Active Problem List   Diagnosis Date Noted  . Carpal tunnel syndrome 02/20/2015   Lady Deutscher PT, DPT [phone] Lady Deutscher 07/30/2016, 12:10 PM  [place] MAIN [facility] SERVICES [address] [place], [place], [address] Phone: [phone]   Fax:  [phone]  Name: Rebecca Sellers MRN: [id number] Date of Birth: 1930-11-06

## 2016-07-31 ENCOUNTER — Ambulatory Visit: Payer: PRIVATE HEALTH INSURANCE | Admitting: Physical Therapy

## 2016-08-02 ENCOUNTER — Encounter: Payer: PRIVATE HEALTH INSURANCE | Admitting: Physical Therapy

## 2016-08-02 ENCOUNTER — Ambulatory Visit: Payer: PRIVATE HEALTH INSURANCE | Admitting: Physical Therapy

## 2016-08-05 ENCOUNTER — Ambulatory Visit: Payer: PRIVATE HEALTH INSURANCE | Admitting: Physical Therapy

## 2016-08-06 ENCOUNTER — Encounter: Payer: Medicare Other | Admitting: Physical Therapy

## 2016-08-09 ENCOUNTER — Encounter: Payer: PRIVATE HEALTH INSURANCE | Admitting: Physical Therapy

## 2016-08-09 ENCOUNTER — Ambulatory Visit: Payer: PRIVATE HEALTH INSURANCE | Admitting: Physical Therapy

## 2016-09-02 ENCOUNTER — Other Ambulatory Visit: Payer: Self-pay | Admitting: Internal Medicine

## 2016-09-02 DIAGNOSIS — I129 Hypertensive chronic kidney disease with stage 1 through stage 4 chronic kidney disease, or unspecified chronic kidney disease: Secondary | ICD-10-CM | POA: Diagnosis not present

## 2016-09-02 DIAGNOSIS — E784 Other hyperlipidemia: Secondary | ICD-10-CM | POA: Diagnosis not present

## 2016-09-02 DIAGNOSIS — R2689 Other abnormalities of gait and mobility: Secondary | ICD-10-CM | POA: Diagnosis not present

## 2016-09-02 DIAGNOSIS — R252 Cramp and spasm: Secondary | ICD-10-CM | POA: Diagnosis not present

## 2016-09-02 DIAGNOSIS — E119 Type 2 diabetes mellitus without complications: Secondary | ICD-10-CM | POA: Diagnosis not present

## 2016-09-02 DIAGNOSIS — Z6823 Body mass index (BMI) 23.0-23.9, adult: Secondary | ICD-10-CM | POA: Diagnosis not present

## 2016-09-02 DIAGNOSIS — R197 Diarrhea, unspecified: Secondary | ICD-10-CM | POA: Diagnosis not present

## 2016-09-02 DIAGNOSIS — R209 Unspecified disturbances of skin sensation: Secondary | ICD-10-CM | POA: Diagnosis not present

## 2016-09-02 DIAGNOSIS — Z1231 Encounter for screening mammogram for malignant neoplasm of breast: Secondary | ICD-10-CM

## 2016-10-10 ENCOUNTER — Ambulatory Visit
Admission: RE | Admit: 2016-10-10 | Discharge: 2016-10-10 | Disposition: A | Discharge status: Home / Self Care | Payer: Medicare Other | Source: Ambulatory Visit | Attending: Internal Medicine | Admitting: Internal Medicine

## 2016-10-10 DIAGNOSIS — Z1231 Encounter for screening mammogram for malignant neoplasm of breast: Secondary | ICD-10-CM | POA: Diagnosis not present

## 2016-10-11 ENCOUNTER — Other Ambulatory Visit: Payer: Self-pay | Admitting: Oncology

## 2016-10-16 ENCOUNTER — Encounter: Payer: Self-pay | Admitting: Physical Therapy

## 2016-10-21 DIAGNOSIS — I10 Essential (primary) hypertension: Secondary | ICD-10-CM | POA: Diagnosis not present

## 2016-10-21 DIAGNOSIS — E119 Type 2 diabetes mellitus without complications: Secondary | ICD-10-CM | POA: Diagnosis not present

## 2016-10-21 DIAGNOSIS — E784 Other hyperlipidemia: Secondary | ICD-10-CM | POA: Diagnosis not present

## 2016-10-21 DIAGNOSIS — E038 Other specified hypothyroidism: Secondary | ICD-10-CM | POA: Diagnosis not present

## 2016-10-21 DIAGNOSIS — E559 Vitamin D deficiency, unspecified: Secondary | ICD-10-CM | POA: Diagnosis not present

## 2016-10-21 DIAGNOSIS — M109 Gout, unspecified: Secondary | ICD-10-CM | POA: Diagnosis not present

## 2016-10-25 DIAGNOSIS — Z1212 Encounter for screening for malignant neoplasm of rectum: Secondary | ICD-10-CM | POA: Diagnosis not present

## 2016-10-28 DIAGNOSIS — Z1389 Encounter for screening for other disorder: Secondary | ICD-10-CM | POA: Diagnosis not present

## 2016-10-28 DIAGNOSIS — R413 Other amnesia: Secondary | ICD-10-CM | POA: Diagnosis not present

## 2016-10-28 DIAGNOSIS — N184 Chronic kidney disease, stage 4 (severe): Secondary | ICD-10-CM | POA: Diagnosis not present

## 2016-10-28 DIAGNOSIS — R2689 Other abnormalities of gait and mobility: Secondary | ICD-10-CM | POA: Diagnosis not present

## 2016-10-28 DIAGNOSIS — I129 Hypertensive chronic kidney disease with stage 1 through stage 4 chronic kidney disease, or unspecified chronic kidney disease: Secondary | ICD-10-CM | POA: Diagnosis not present

## 2016-10-28 DIAGNOSIS — Z6823 Body mass index (BMI) 23.0-23.9, adult: Secondary | ICD-10-CM | POA: Diagnosis not present

## 2016-10-28 DIAGNOSIS — R252 Cramp and spasm: Secondary | ICD-10-CM | POA: Diagnosis not present

## 2016-10-28 DIAGNOSIS — D692 Other nonthrombocytopenic purpura: Secondary | ICD-10-CM | POA: Diagnosis not present

## 2016-10-28 DIAGNOSIS — E119 Type 2 diabetes mellitus without complications: Secondary | ICD-10-CM | POA: Diagnosis not present

## 2016-10-28 DIAGNOSIS — Z23 Encounter for immunization: Secondary | ICD-10-CM | POA: Diagnosis not present

## 2016-10-28 DIAGNOSIS — Z0000 Encounter for general adult medical examination without abnormal findings: Secondary | ICD-10-CM | POA: Diagnosis not present

## 2016-10-28 DIAGNOSIS — R197 Diarrhea, unspecified: Secondary | ICD-10-CM | POA: Diagnosis not present

## 2016-10-29 DIAGNOSIS — E119 Type 2 diabetes mellitus without complications: Secondary | ICD-10-CM | POA: Diagnosis not present

## 2016-11-08 ENCOUNTER — Other Ambulatory Visit (HOSPITAL_COMMUNITY): Payer: Self-pay | Admitting: *Deleted

## 2016-11-11 ENCOUNTER — Ambulatory Visit (HOSPITAL_COMMUNITY)
Admission: RE | Admit: 2016-11-11 | Discharge: 2016-11-11 | Disposition: A | Discharge status: Home / Self Care | Payer: Medicare Other | Source: Ambulatory Visit | Attending: Internal Medicine | Admitting: Internal Medicine

## 2016-11-11 DIAGNOSIS — M810 Age-related osteoporosis without current pathological fracture: Secondary | ICD-10-CM | POA: Insufficient documentation

## 2016-11-11 MED ORDER — DENOSUMAB 60 MG/ML ~~LOC~~ SOLN
60.0000 mg | Freq: Once | SUBCUTANEOUS | Status: AC
Start: 2016-11-11 — End: 2016-11-11
  Administered 2016-11-11: 60 mg via SUBCUTANEOUS
  Filled 2016-11-11: qty 1

## 2016-11-20 DIAGNOSIS — H903 Sensorineural hearing loss, bilateral: Secondary | ICD-10-CM | POA: Diagnosis not present

## 2016-12-13 DIAGNOSIS — S51801A Unspecified open wound of right forearm, initial encounter: Secondary | ICD-10-CM | POA: Diagnosis not present

## 2016-12-13 DIAGNOSIS — D229 Melanocytic nevi, unspecified: Secondary | ICD-10-CM | POA: Diagnosis not present

## 2016-12-13 DIAGNOSIS — L820 Inflamed seborrheic keratosis: Secondary | ICD-10-CM | POA: Diagnosis not present

## 2016-12-13 DIAGNOSIS — L814 Other melanin hyperpigmentation: Secondary | ICD-10-CM | POA: Diagnosis not present

## 2016-12-13 DIAGNOSIS — L821 Other seborrheic keratosis: Secondary | ICD-10-CM | POA: Diagnosis not present

## 2016-12-13 DIAGNOSIS — L249 Irritant contact dermatitis, unspecified cause: Secondary | ICD-10-CM | POA: Diagnosis not present

## 2016-12-13 DIAGNOSIS — D1801 Hemangioma of skin and subcutaneous tissue: Secondary | ICD-10-CM | POA: Diagnosis not present

## 2017-02-25 DIAGNOSIS — H9193 Unspecified hearing loss, bilateral: Secondary | ICD-10-CM | POA: Diagnosis not present

## 2017-02-25 DIAGNOSIS — M810 Age-related osteoporosis without current pathological fracture: Secondary | ICD-10-CM | POA: Diagnosis not present

## 2017-02-25 DIAGNOSIS — Z6823 Body mass index (BMI) 23.0-23.9, adult: Secondary | ICD-10-CM | POA: Diagnosis not present

## 2017-02-25 DIAGNOSIS — E1122 Type 2 diabetes mellitus with diabetic chronic kidney disease: Secondary | ICD-10-CM | POA: Diagnosis not present

## 2017-02-25 DIAGNOSIS — R2689 Other abnormalities of gait and mobility: Secondary | ICD-10-CM | POA: Diagnosis not present

## 2017-02-25 DIAGNOSIS — D692 Other nonthrombocytopenic purpura: Secondary | ICD-10-CM | POA: Diagnosis not present

## 2017-02-25 DIAGNOSIS — R413 Other amnesia: Secondary | ICD-10-CM | POA: Diagnosis not present

## 2017-02-25 DIAGNOSIS — I129 Hypertensive chronic kidney disease with stage 1 through stage 4 chronic kidney disease, or unspecified chronic kidney disease: Secondary | ICD-10-CM | POA: Diagnosis not present

## 2017-02-25 DIAGNOSIS — E7849 Other hyperlipidemia: Secondary | ICD-10-CM | POA: Diagnosis not present

## 2017-02-25 DIAGNOSIS — E038 Other specified hypothyroidism: Secondary | ICD-10-CM | POA: Diagnosis not present

## 2017-02-25 DIAGNOSIS — N184 Chronic kidney disease, stage 4 (severe): Secondary | ICD-10-CM | POA: Diagnosis not present

## 2017-02-28 ENCOUNTER — Other Ambulatory Visit: Payer: Self-pay | Admitting: Internal Medicine

## 2017-02-28 DIAGNOSIS — R7989 Other specified abnormal findings of blood chemistry: Secondary | ICD-10-CM

## 2017-03-05 ENCOUNTER — Ambulatory Visit
Admission: RE | Admit: 2017-03-05 | Discharge: 2017-03-05 | Disposition: A | Discharge status: Home / Self Care | Payer: Medicare Other | Source: Ambulatory Visit | Attending: Internal Medicine | Admitting: Internal Medicine

## 2017-03-05 DIAGNOSIS — N189 Chronic kidney disease, unspecified: Secondary | ICD-10-CM | POA: Diagnosis not present

## 2017-03-05 DIAGNOSIS — R7989 Other specified abnormal findings of blood chemistry: Secondary | ICD-10-CM

## 2017-03-05 DIAGNOSIS — R944 Abnormal results of kidney function studies: Secondary | ICD-10-CM | POA: Diagnosis not present

## 2017-05-21 DIAGNOSIS — I10 Essential (primary) hypertension: Secondary | ICD-10-CM | POA: Diagnosis not present

## 2017-05-21 DIAGNOSIS — D649 Anemia, unspecified: Secondary | ICD-10-CM | POA: Diagnosis not present

## 2017-06-02 DIAGNOSIS — D649 Anemia, unspecified: Secondary | ICD-10-CM | POA: Diagnosis not present

## 2017-06-16 DIAGNOSIS — Z6823 Body mass index (BMI) 23.0-23.9, adult: Secondary | ICD-10-CM | POA: Diagnosis not present

## 2017-06-16 DIAGNOSIS — G6289 Other specified polyneuropathies: Secondary | ICD-10-CM | POA: Diagnosis not present

## 2017-06-16 DIAGNOSIS — D649 Anemia, unspecified: Secondary | ICD-10-CM | POA: Diagnosis not present

## 2017-06-16 DIAGNOSIS — E038 Other specified hypothyroidism: Secondary | ICD-10-CM | POA: Diagnosis not present

## 2017-06-16 DIAGNOSIS — N184 Chronic kidney disease, stage 4 (severe): Secondary | ICD-10-CM | POA: Diagnosis not present

## 2017-06-16 DIAGNOSIS — R195 Other fecal abnormalities: Secondary | ICD-10-CM | POA: Diagnosis not present

## 2017-06-16 DIAGNOSIS — I129 Hypertensive chronic kidney disease with stage 1 through stage 4 chronic kidney disease, or unspecified chronic kidney disease: Secondary | ICD-10-CM | POA: Diagnosis not present

## 2017-06-16 DIAGNOSIS — E119 Type 2 diabetes mellitus without complications: Secondary | ICD-10-CM | POA: Diagnosis not present

## 2017-06-16 DIAGNOSIS — M545 Low back pain: Secondary | ICD-10-CM | POA: Diagnosis not present

## 2017-06-18 DIAGNOSIS — D500 Iron deficiency anemia secondary to blood loss (chronic): Secondary | ICD-10-CM | POA: Diagnosis not present

## 2017-06-18 DIAGNOSIS — R195 Other fecal abnormalities: Secondary | ICD-10-CM | POA: Diagnosis not present

## 2017-06-20 DIAGNOSIS — K259 Gastric ulcer, unspecified as acute or chronic, without hemorrhage or perforation: Secondary | ICD-10-CM | POA: Diagnosis not present

## 2017-06-20 DIAGNOSIS — K222 Esophageal obstruction: Secondary | ICD-10-CM | POA: Diagnosis not present

## 2017-06-20 DIAGNOSIS — K317 Polyp of stomach and duodenum: Secondary | ICD-10-CM | POA: Diagnosis not present

## 2017-06-24 DIAGNOSIS — K317 Polyp of stomach and duodenum: Secondary | ICD-10-CM | POA: Diagnosis not present

## 2017-07-14 DIAGNOSIS — E119 Type 2 diabetes mellitus without complications: Secondary | ICD-10-CM | POA: Diagnosis not present

## 2017-08-28 DIAGNOSIS — K591 Functional diarrhea: Secondary | ICD-10-CM | POA: Diagnosis not present

## 2017-08-28 DIAGNOSIS — D500 Iron deficiency anemia secondary to blood loss (chronic): Secondary | ICD-10-CM | POA: Diagnosis not present

## 2017-08-28 DIAGNOSIS — R195 Other fecal abnormalities: Secondary | ICD-10-CM | POA: Diagnosis not present

## 2017-08-28 DIAGNOSIS — K219 Gastro-esophageal reflux disease without esophagitis: Secondary | ICD-10-CM | POA: Diagnosis not present

## 2017-08-28 DIAGNOSIS — K253 Acute gastric ulcer without hemorrhage or perforation: Secondary | ICD-10-CM | POA: Diagnosis not present

## 2017-09-01 ENCOUNTER — Other Ambulatory Visit: Payer: Self-pay | Admitting: Internal Medicine

## 2017-09-01 DIAGNOSIS — Z1231 Encounter for screening mammogram for malignant neoplasm of breast: Secondary | ICD-10-CM

## 2017-09-04 DIAGNOSIS — C189 Malignant neoplasm of colon, unspecified: Secondary | ICD-10-CM | POA: Diagnosis not present

## 2017-09-04 DIAGNOSIS — C182 Malignant neoplasm of ascending colon: Secondary | ICD-10-CM | POA: Diagnosis not present

## 2017-09-04 DIAGNOSIS — R195 Other fecal abnormalities: Secondary | ICD-10-CM | POA: Diagnosis not present

## 2017-09-05 DIAGNOSIS — C189 Malignant neoplasm of colon, unspecified: Secondary | ICD-10-CM | POA: Diagnosis not present

## 2017-09-10 DIAGNOSIS — C182 Malignant neoplasm of ascending colon: Secondary | ICD-10-CM | POA: Diagnosis not present

## 2017-09-11 ENCOUNTER — Other Ambulatory Visit: Payer: Self-pay | Admitting: Surgery

## 2017-09-11 DIAGNOSIS — C182 Malignant neoplasm of ascending colon: Secondary | ICD-10-CM

## 2017-09-12 ENCOUNTER — Other Ambulatory Visit: Payer: Self-pay | Admitting: Surgery

## 2017-09-12 ENCOUNTER — Ambulatory Visit
Admission: RE | Admit: 2017-09-12 | Discharge: 2017-09-12 | Disposition: A | Discharge status: Home / Self Care | Payer: Medicare Other | Source: Ambulatory Visit | Attending: Surgery | Admitting: Surgery

## 2017-09-12 DIAGNOSIS — C182 Malignant neoplasm of ascending colon: Secondary | ICD-10-CM | POA: Insufficient documentation

## 2017-09-12 DIAGNOSIS — I700 Atherosclerosis of aorta: Secondary | ICD-10-CM | POA: Diagnosis not present

## 2017-09-12 DIAGNOSIS — C189 Malignant neoplasm of colon, unspecified: Secondary | ICD-10-CM | POA: Diagnosis not present

## 2017-09-12 HISTORY — DX: Malignant (primary) neoplasm, unspecified: C80.1

## 2017-09-12 LAB — POCT I-STAT CREATININE: Creatinine, Ser: 1.7 mg/dL — ABNORMAL HIGH (ref 0.44–1.00)

## 2017-09-24 NOTE — H&P (Signed)
Drucilla Chalet Documented: 09/10/2017 9:38 AM Location: [place] Office Patient #: [id number] DOB: 18-Nov-1930 Widowed / Language: Cleophus Molt / Race: White Female   History of Present Illness Rodman Key B. Hassell Done MD; 09/10/2017 10:26 AM) The patient is a 84 year old female who presents with colorectal cancer. Ms. Mian is referred by Dr. Cristina Gong and Dr. Virgina Jock with a circumferential cancer in the ascending colon that has been tatooed. She was being evaluated for diarrhea and tarry stools. Biopsy indicates a invasive moderately differentiated adenocarcinoma of the right colon which was tatooed. I have discussed lap-assisted right hemicolectomy with her in some detail. She lives in [place] but I indicated I would prefer to do her surgery at [facility]. I indicated that there is always a chance of a temporary colostomy although would not expect this. She has had a prior laparoscopic cholecystectomy. She is felt to be in good health until this recent diagnosis. She has a beach trip scheduled for Sept 8th. Will proceed with scheduling lap assisted right hemicolectomy .    Past Surgical History Malachi Bonds, CMA; 09/10/2017 9:39 AM) Cataract Surgery  Bilateral. Colon Polyp Removal - Colonoscopy  Colon Removal - Complete  Gallbladder Surgery - Laparoscopic  Hysterectomy (not due to cancer) - Partial   Diagnostic Studies History Malachi Bonds, CMA; 09/10/2017 9:39 AM) Colonoscopy  within last year Mammogram  within last year  Allergies Malachi Bonds, CMA; 09/10/2017 9:42 AM) Meloxicam *ANALGESICS - ANTI-INFLAMMATORY*  ALPRAZolam ER *ANTIANXIETY AGENTS*  Sulfa Antibiotics  TraZODone HCl *ANTIDEPRESSANTS*  Relafen *ANALGESICS - ANTI-INFLAMMATORY*  Garlic *ALTERNATIVE MEDICINES*   Medication History Malachi Bonds, CMA; 09/10/2017 9:44 AM) Chanda Busing (2MG  Tablet, Oral) Active. Levothyroxine Sodium (75MCG Tablet, Oral) Active. Lisinopril (40MG  Tablet, Oral) Active. AmLODIPine  Besylate (2.5MG  Tablet, Oral) Active. Fenofibrate (160MG  Tablet, Oral) Active. Prolia (60MG /ML Solution, Subcutaneous) Active. Vitamin D3 (400UNIT Capsule, Oral) Active. Vitamin B-12 (250MCG Tablet, Oral) Active. Tylenol (500MG  Capsule, Oral) Active. Prenatal (Oral) Active. Medications Reconciled  Social History Malachi Bonds, CMA; 09/10/2017 9:39 AM) Alcohol use  Occasional alcohol use. No caffeine use  No drug use  Tobacco use  Former smoker.  Family History Malachi Bonds, CMA; 09/10/2017 9:39 AM) Breast Cancer  Mother. Heart disease in female family member before age 48   Pregnancy / Birth History Malachi Bonds, [place]; 09/10/2017 9:39 AM) Age at menarche  66 years. Gravida  1 Maternal age  51-25 Para  1  Other Problems Malachi Bonds, CMA; 09/10/2017 9:39 AM) Anxiety Disorder  Arthritis  Back Pain  Colon Cancer  Diabetes Mellitus  Diverticulosis  Gastric Ulcer  Gastroesophageal Reflux Disease  High blood pressure  Hypercholesterolemia  Thyroid Disease     Review of Systems (Chemira Jones CMA; 09/10/2017 9:39 AM) General Present- Fatigue. Not Present- Appetite Loss, Chills, Fever, Night Sweats, Weight Gain and Weight Loss. Skin Not Present- Change in Wart/Mole, Dryness, Hives, Jaundice, New Lesions, Non-Healing Wounds, Rash and Ulcer. HEENT Present- Hearing Loss and Seasonal Allergies. Not Present- Earache, Hoarseness, Nose Bleed, Oral Ulcers, Ringing in the Ears, Sinus Pain, Sore Throat, Visual Disturbances, Wears glasses/contact lenses and Yellow Eyes. Respiratory Not Present- Bloody sputum, Chronic Cough, Difficulty Breathing, Snoring and Wheezing. Breast Not Present- Breast Mass, Breast Pain, Nipple Discharge and Skin Changes. Cardiovascular Present- Leg Cramps. Not Present- Chest Pain, Difficulty Breathing Lying Down, Palpitations, Rapid Heart Rate, Shortness of Breath and Swelling of Extremities. Gastrointestinal Present- Bloody Stool, Change  in Bowel Habits, Chronic diarrhea and Indigestion. Not Present- Abdominal Pain, Bloating, Constipation, Difficulty Swallowing, Excessive gas, Gets full  quickly at meals, Hemorrhoids, Nausea, Rectal Pain and Vomiting. Female Genitourinary Present- Frequency and Urgency. Not Present- Nocturia, Painful Urination and Pelvic Pain. Musculoskeletal Present- Back Pain, Muscle Pain and Muscle Weakness. Not Present- Joint Pain, Joint Stiffness and Swelling of Extremities. Neurological Present- Numbness, Tingling and Trouble walking. Not Present- Decreased Memory, Fainting, Headaches, Seizures, Tremor and Weakness. Psychiatric Present- Anxiety, Change in Sleep Pattern and Fearful. Not Present- Bipolar, Depression and Frequent crying. Endocrine Present- Hair Changes. Not Present- Cold Intolerance, Excessive Hunger, Heat Intolerance, Hot flashes and New Diabetes. Hematology Present- Easy Bruising and Excessive bleeding. Not Present- Blood Thinners, Gland problems, HIV and Persistent Infections.  Vitals (Chemira Jones CMA; 09/10/2017 9:41 AM) 09/10/2017 9:41 AM Weight: 129 lb Height: 63in Body Surface Area: 1.61 m Body Mass Index: 22.85 kg/m  Pulse: 85 (Regular)  BP: 118/74 (Sitting, Left Arm, Standard)       Physical Exam (Keeven Matty B. Hassell Done MD; 09/10/2017 10:27 AM) General Note: Well maintained younger than 49 appearing WF HEENT unremarkable Neck no bruits Chest clear Heart SR Abdomen nondistended Ext FROM Neuro alert and oriented x 3 with normal motor and sensory function.     Assessment & Plan Rodman Key B. Hassell Done MD; 09/10/2017 10:29 AM) COLON CANCER, ASCENDING (C18.2) Story: Will get a CT scan before surgery. Impression: newly diagnosed circumferential cancer of the descending colon post colonoscopy by Dr. Cristina Gong and tattooing. Will schedule lap assisted right colectomy at [place]. Hassell Done, MD, FACS

## 2017-09-24 NOTE — Patient Instructions (Addendum)
Rebecca Sellers  09/24/2017   Your procedure is scheduled on: Monday 09/29/2017  Report to [facility] Main  Entrance              Report to admitting at  0700  AM    Call this number if you have problems the morning of surgery [phone]               Follow the bowel prep instructions from Dr. Earlie Server office on 09/28/2017 with a clear liquid diet all day.   DRINK 2 PRESURGERY ENSURE DRINKS THE NIGHT BEFORE SURGERY AT  1000 PM AND 1 PRESURGERY DRINK THE DAY OF THE PROCEDURE 3 HOURS PRIOR TO SCHEDULED SURGERY. NO SOLIDS AFTER MIDNIGHT THE DAY PRIOR TO THE SURGERY. NOTHING BY MOUTH EXCEPT CLEAR LIQUIDS UNTIL THREE HOURS PRIOR TO SCHEDULED SURGERY. PLEASE FINISH PRESURGERY ENSURE DRINK PER SURGEON ORDER 3 HOURS PRIOR TO SCHEDULED SURGERY TIME WHICH NEEDS TO BE COMPLETED AT  0600. Am.     CLEAR LIQUID DIET   Foods Allowed                                                                     Foods Excluded  Coffee and tea, regular and decaf                             liquids that you cannot  Plain Jell-O in any flavor                                             see through such as: Fruit ices (not with fruit pulp)                                     milk, soups, orange juice  Iced Popsicles                                    All solid food Carbonated beverages, regular and diet                                    Cranberry, grape and apple juices Sports drinks like Gatorade Lightly seasoned clear broth or consume(fat free) Sugar, honey syrup  Sample Menu Breakfast                                Lunch                                     Supper Cranberry juice                    Beef broth  Chicken broth Jell-O                                     Grape juice                           Apple juice Coffee or tea                        Jell-O                                      Popsicle                                                 Coffee or tea                        Coffee or tea  _____________________________________________________________________    Remember: Do not eat food :After Midnight.   How to Manage Your Diabetes Before and After Surgery  Why is it important to control my blood sugar before and after surgery? . Improving blood sugar levels before and after surgery helps healing and can limit problems. . A way of improving blood sugar control is eating a healthy diet by: o  Eating less sugar and carbohydrates o  Increasing activity/exercise o  Talking with your doctor about reaching your blood sugar goals . High blood sugars (greater than 180 mg/dL) can raise your risk of infections and slow your recovery, so you will need to focus on controlling your diabetes during the weeks before surgery. . Make sure that the doctor who takes care of your diabetes knows about your planned surgery including the date and location.  How do I manage my blood sugar before surgery? . Check your blood sugar at least 4 times a day, starting 2 days before surgery, to make sure that the level is not too high or low. o Check your blood sugar the morning of your surgery when you wake up and every 2 hours until you get to the Short Stay unit. . If your blood sugar is less than 70 mg/dL, you will need to treat for low blood sugar: o Do not take insulin. o Treat a low blood sugar (less than 70 mg/dL) with  cup of clear juice (cranberry or apple), 4 glucose tablets, OR glucose gel. o Recheck blood sugar in 15 minutes after treatment (to make sure it is greater than 70 mg/dL). If your blood sugar is not greater than 70 mg/dL on recheck, call [phone] for further instructions. . Report your blood sugar to the short stay nurse when you get to Short Stay.  . If you are admitted to the hospital after surgery: o Your blood sugar will be checked by the staff and you will probably be given insulin after surgery (instead of oral  diabetes medicines) to make sure you have good blood sugar levels. o The goal for blood sugar control after surgery is 80-180 mg/dL.   WHAT DO I DO ABOUT MY DIABETES MEDICATION?  Marland Kitchen Do not take oral diabetes medicines (pills) the morning of surgery.  Take these medicines the morning of surgery with A SIP OF WATER: Levothyroxine (Synthroid)Amlodipine (Norvasc), Famotidine (Pepcid), Fenofibrate, use Flonase nasal spray if needed,Pitavastatin (Livalo)   DO NOT TAKE ANY DIABETIC MEDICATIONS DAY OF YOUR SURGERY                               You may not have any metal on your body including hair pins and              piercings  Do not wear jewelry, make-up, lotions, powders or perfumes, deodorant             Do not wear nail polish.  Do not shave  48 hours prior to surgery.               Do not bring valuables to the hospital. [place].  Contacts, dentures or bridgework may not be worn into surgery.  Leave suitcase in the car. After surgery it may be brought to your room.                  Please read over the following fact sheets you were given: _____________________________________________________________________             [facility] - Preparing for Surgery Before surgery, you can play an important role.  Because skin is not sterile, your skin needs to be as free of germs as possible.  You can reduce the number of germs on your skin by washing with CHG (chlorahexidine gluconate) soap before surgery.  CHG is an antiseptic cleaner which kills germs and bonds with the skin to continue killing germs even after washing. Please DO NOT use if you have an allergy to CHG or antibacterial soaps.  If your skin becomes reddened/irritated stop using the CHG and inform your nurse when you arrive at Short Stay. Do not shave (including legs and underarms) for at least 48 hours prior to the first CHG shower.  You may shave your  face/neck. Please follow these instructions carefully:  1.  Shower with CHG Soap the night before surgery and the  morning of Surgery.  2.  If you choose to wash your hair, wash your hair first as usual with your  normal  shampoo.  3.  After you shampoo, rinse your hair and body thoroughly to remove the  shampoo.                           4.  Use CHG as you would any other liquid soap.  You can apply chg directly  to the skin and wash                       Gently with a scrungie or clean washcloth.  5.  Apply the CHG Soap to your body ONLY FROM THE NECK DOWN.   Do not use on face/ open                           Wound or open sores. Avoid contact with eyes, ears mouth and genitals (private parts).                       Wash face,  Development worker, international aid (private  parts) with your normal soap.             6.  Wash thoroughly, paying special attention to the area where your surgery  will be performed.  7.  Thoroughly rinse your body with warm water from the neck down.  8.  DO NOT shower/wash with your normal soap after using and rinsing off  the CHG Soap.                9.  Pat yourself dry with a clean towel.            10.  Wear clean pajamas.            11.  Place clean sheets on your bed the night of your first shower and do not  sleep with pets. Day of Surgery : Do not apply any lotions/deodorants the morning of surgery.  Please wear clean clothes to the hospital/surgery center.  FAILURE TO FOLLOW THESE INSTRUCTIONS MAY RESULT IN THE CANCELLATION OF YOUR SURGERY PATIENT SIGNATURE_________________________________  NURSE SIGNATURE__________________________________  ________________________________________________________________________   Rebecca Sellers  An incentive spirometer is a tool that can help keep your lungs clear and active. This tool measures how well you are filling your lungs with each breath. Taking long deep breaths may help reverse or decrease the chance of developing breathing  (pulmonary) problems (especially infection) following:  A long period of time when you are unable to move or be active. BEFORE THE PROCEDURE   If the spirometer includes an indicator to show your best effort, your nurse or respiratory therapist will set it to a desired goal.  If possible, sit up straight or lean slightly forward. Try not to slouch.  Hold the incentive spirometer in an upright position. INSTRUCTIONS FOR USE  1. Sit on the edge of your bed if possible, or sit up as far as you can in bed or on a chair. 2. Hold the incentive spirometer in an upright position. 3. Breathe out normally. 4. Place the mouthpiece in your mouth and seal your lips tightly around it. 5. Breathe in slowly and as deeply as possible, raising the piston or the ball toward the top of the column. 6. Hold your breath for 3-5 seconds or for as long as possible. Allow the piston or ball to fall to the bottom of the column. 7. Remove the mouthpiece from your mouth and breathe out normally. 8. Rest for a few seconds and repeat Steps 1 through 7 at least 10 times every 1-2 hours when you are awake. Take your time and take a few normal breaths between deep breaths. 9. The spirometer may include an indicator to show your best effort. Use the indicator as a goal to work toward during each repetition. 10. After each set of 10 deep breaths, practice coughing to be sure your lungs are clear. If you have an incision (the cut made at the time of surgery), support your incision when coughing by placing a pillow or rolled up towels firmly against it. Once you are able to get out of bed, walk around indoors and cough well. You may stop using the incentive spirometer when instructed by your caregiver.  RISKS AND COMPLICATIONS  Take your time so you do not get dizzy or light-headed.  If you are in pain, you may need to take or ask for pain medication before doing incentive spirometry. It is harder to take a deep breath if you  are having pain. AFTER USE  Rest  and breathe slowly and easily.  It can be helpful to keep track of a log of your progress. Your caregiver can provide you with a simple table to help with this. If you are using the spirometer at home, follow these instructions: [place] IF:   You are having difficultly using the spirometer.  You have trouble using the spirometer as often as instructed.  Your pain medication is not giving enough relief while using the spirometer.  You develop fever of 100.5 F (38.1 C) or higher. SEEK IMMEDIATE MEDICAL CARE IF:   You cough up bloody sputum that had not been present before.  You develop fever of 102 F (38.9 C) or greater.  You develop worsening pain at or near the incision site. MAKE SURE YOU:   Understand these instructions.  Will watch your condition.  Will get help right away if you are not doing well or get worse. Document Released: 06/03/2006 Document Revised: 04/15/2011 Document Reviewed: 08/04/2006 [facility] Patient Information 2014 [place], [place].   ________________________________________________________________________

## 2017-09-25 ENCOUNTER — Encounter (HOSPITAL_COMMUNITY)
Admission: RE | Admit: 2017-09-25 | Discharge: 2017-09-25 | Disposition: A | Discharge status: Home / Self Care | Payer: Medicare Other | Source: Ambulatory Visit | Attending: Surgery | Admitting: Surgery

## 2017-09-25 ENCOUNTER — Encounter (HOSPITAL_COMMUNITY): Payer: Self-pay

## 2017-09-25 ENCOUNTER — Other Ambulatory Visit: Payer: Self-pay

## 2017-09-25 DIAGNOSIS — E119 Type 2 diabetes mellitus without complications: Secondary | ICD-10-CM | POA: Insufficient documentation

## 2017-09-25 DIAGNOSIS — E039 Hypothyroidism, unspecified: Secondary | ICD-10-CM | POA: Diagnosis not present

## 2017-09-25 DIAGNOSIS — I10 Essential (primary) hypertension: Secondary | ICD-10-CM | POA: Diagnosis not present

## 2017-09-25 DIAGNOSIS — D649 Anemia, unspecified: Secondary | ICD-10-CM | POA: Diagnosis not present

## 2017-09-25 DIAGNOSIS — K219 Gastro-esophageal reflux disease without esophagitis: Secondary | ICD-10-CM | POA: Diagnosis not present

## 2017-09-25 DIAGNOSIS — C189 Malignant neoplasm of colon, unspecified: Secondary | ICD-10-CM | POA: Diagnosis not present

## 2017-09-25 DIAGNOSIS — Z01812 Encounter for preprocedural laboratory examination: Secondary | ICD-10-CM | POA: Insufficient documentation

## 2017-09-25 DIAGNOSIS — Z87891 Personal history of nicotine dependence: Secondary | ICD-10-CM | POA: Diagnosis not present

## 2017-09-25 HISTORY — DX: Gastro-esophageal reflux disease without esophagitis: K21.9

## 2017-09-25 HISTORY — DX: Hypothyroidism, unspecified: E03.9

## 2017-09-25 LAB — CBC
HCT: 32.7 % — ABNORMAL LOW (ref 36.0–46.0)
Hemoglobin: 10.1 g/dL — ABNORMAL LOW (ref 12.0–15.0)
MCH: 27.4 pg (ref 26.0–34.0)
MCHC: 30.9 g/dL (ref 30.0–36.0)
MCV: 88.6 fL (ref 78.0–100.0)
PLATELETS: 318 10*3/uL (ref 150–400)
RBC: 3.69 MIL/uL — ABNORMAL LOW (ref 3.87–5.11)
RDW: 13.5 % (ref 11.5–15.5)
WBC: 7 10*3/uL (ref 4.0–10.5)

## 2017-09-25 LAB — BASIC METABOLIC PANEL
ANION GAP: 11 (ref 5–15)
BUN: 39 mg/dL — ABNORMAL HIGH (ref 8–23)
CALCIUM: 9.6 mg/dL (ref 8.9–10.3)
CO2: 22 mmol/L (ref 22–32)
Chloride: 102 mmol/L (ref 98–111)
Creatinine, Ser: 1.74 mg/dL — ABNORMAL HIGH (ref 0.44–1.00)
GFR calc Af Amer: 30 mL/min — ABNORMAL LOW (ref >60)
GFR, EST NON AFRICAN AMERICAN: 26 mL/min — AB (ref >60)
GLUCOSE: 148 mg/dL — AB (ref 70–99)
Potassium: 4.4 mmol/L (ref 3.5–5.1)
Sodium: 135 mmol/L (ref 135–145)

## 2017-09-25 LAB — HEMOGLOBIN A1C
Hgb A1c MFr Bld: 6.3 % — ABNORMAL HIGH (ref 4.8–5.6)
MEAN PLASMA GLUCOSE: 134.11 mg/dL

## 2017-09-25 LAB — GLUCOSE, CAPILLARY: GLUCOSE-CAPILLARY: 144 mg/dL — AB (ref 70–99)

## 2017-09-28 MED ORDER — BUPIVACAINE LIPOSOME 1.3 % IJ SUSP
20.0000 mL | Freq: Once | INTRAMUSCULAR | Status: DC
  Filled 2017-09-28: qty 20

## 2017-09-29 ENCOUNTER — Inpatient Hospital Stay (HOSPITAL_COMMUNITY): Payer: Medicare Other | Admitting: Certified Registered"

## 2017-09-29 ENCOUNTER — Other Ambulatory Visit: Payer: Self-pay

## 2017-09-29 ENCOUNTER — Encounter (HOSPITAL_COMMUNITY)
Admission: RE | Disposition: A | Discharge status: Home / Self Care | Payer: Self-pay | Source: Home / Self Care | Attending: Surgery

## 2017-09-29 ENCOUNTER — Inpatient Hospital Stay (HOSPITAL_COMMUNITY)
Admission: RE | Admit: 2017-09-29 | Discharge: 2017-10-03 | DRG: 331 | Disposition: A | Discharge status: Home / Self Care | Payer: Medicare Other | Attending: Surgery | Admitting: Surgery

## 2017-09-29 ENCOUNTER — Encounter (HOSPITAL_COMMUNITY): Payer: Self-pay | Admitting: Certified Registered"

## 2017-09-29 DIAGNOSIS — Z79890 Hormone replacement therapy (postmenopausal): Secondary | ICD-10-CM

## 2017-09-29 DIAGNOSIS — Z90710 Acquired absence of both cervix and uterus: Secondary | ICD-10-CM | POA: Diagnosis not present

## 2017-09-29 DIAGNOSIS — Z87891 Personal history of nicotine dependence: Secondary | ICD-10-CM

## 2017-09-29 DIAGNOSIS — C189 Malignant neoplasm of colon, unspecified: Secondary | ICD-10-CM

## 2017-09-29 DIAGNOSIS — Z886 Allergy status to analgesic agent status: Secondary | ICD-10-CM

## 2017-09-29 DIAGNOSIS — D649 Anemia, unspecified: Secondary | ICD-10-CM | POA: Diagnosis not present

## 2017-09-29 DIAGNOSIS — E119 Type 2 diabetes mellitus without complications: Secondary | ICD-10-CM | POA: Diagnosis present

## 2017-09-29 DIAGNOSIS — Z79899 Other long term (current) drug therapy: Secondary | ICD-10-CM

## 2017-09-29 DIAGNOSIS — Z882 Allergy status to sulfonamides status: Secondary | ICD-10-CM | POA: Diagnosis not present

## 2017-09-29 DIAGNOSIS — I10 Essential (primary) hypertension: Secondary | ICD-10-CM | POA: Diagnosis not present

## 2017-09-29 DIAGNOSIS — K219 Gastro-esophageal reflux disease without esophagitis: Secondary | ICD-10-CM | POA: Diagnosis not present

## 2017-09-29 DIAGNOSIS — Z888 Allergy status to other drugs, medicaments and biological substances status: Secondary | ICD-10-CM | POA: Diagnosis not present

## 2017-09-29 DIAGNOSIS — E039 Hypothyroidism, unspecified: Secondary | ICD-10-CM | POA: Diagnosis not present

## 2017-09-29 DIAGNOSIS — Z9049 Acquired absence of other specified parts of digestive tract: Secondary | ICD-10-CM

## 2017-09-29 DIAGNOSIS — C182 Malignant neoplasm of ascending colon: Secondary | ICD-10-CM | POA: Diagnosis not present

## 2017-09-29 HISTORY — PX: LAPAROSCOPIC PARTIAL COLECTOMY: SHX5907

## 2017-09-29 HISTORY — DX: Malignant neoplasm of colon, unspecified: C18.9

## 2017-09-29 LAB — GLUCOSE, CAPILLARY
GLUCOSE-CAPILLARY: 140 mg/dL — AB (ref 70–99)
Glucose-Capillary: 189 mg/dL — ABNORMAL HIGH (ref 70–99)
Glucose-Capillary: 202 mg/dL — ABNORMAL HIGH (ref 70–99)
Glucose-Capillary: 194 mg/dL — ABNORMAL HIGH (ref 70–99)

## 2017-09-29 LAB — CBC
HEMATOCRIT: 29.7 % — AB (ref 36.0–46.0)
HEMOGLOBIN: 9.3 g/dL — AB (ref 12.0–15.0)
MCH: 26.6 pg (ref 26.0–34.0)
MCHC: 31.3 g/dL (ref 30.0–36.0)
MCV: 84.9 fL (ref 78.0–100.0)
Platelets: 265 10*3/uL (ref 150–400)
RBC: 3.5 MIL/uL — AB (ref 3.87–5.11)
RDW: 13.1 % (ref 11.5–15.5)
WBC: 10.2 10*3/uL (ref 4.0–10.5)

## 2017-09-29 LAB — CREATININE, SERUM
Creatinine, Ser: 1.55 mg/dL — ABNORMAL HIGH (ref 0.44–1.00)
GFR calc Af Amer: 34 mL/min — ABNORMAL LOW (ref >60)
GFR, EST NON AFRICAN AMERICAN: 29 mL/min — AB (ref >60)

## 2017-09-29 LAB — ABO/RH: ABO/RH(D): O NEG

## 2017-09-29 LAB — TYPE AND SCREEN
ABO/RH(D): O NEG
Antibody Screen: NEGATIVE

## 2017-09-29 SURGERY — LAPAROSCOPIC PARTIAL COLECTOMY
Anesthesia: General | Site: Abdomen

## 2017-09-29 MED ORDER — SUGAMMADEX SODIUM 200 MG/2ML IV SOLN
INTRAVENOUS | Status: DC | PRN
  Administered 2017-09-29: 150 mg via INTRAVENOUS

## 2017-09-29 MED ORDER — 0.9 % SODIUM CHLORIDE (POUR BTL) OPTIME
TOPICAL | Status: DC | PRN
  Administered 2017-09-29: 1000 mL

## 2017-09-29 MED ORDER — PHENYLEPHRINE HCL-NACL 10-0.9 MG/250ML-% IV SOLN
INTRAVENOUS | Status: AC
  Filled 2017-09-29: qty 250

## 2017-09-29 MED ORDER — CELECOXIB 200 MG PO CAPS
200.0000 mg | ORAL_CAPSULE | ORAL | Status: AC
  Administered 2017-09-29: 200 mg via ORAL
  Filled 2017-09-29: qty 1

## 2017-09-29 MED ORDER — FENTANYL CITRATE (PF) 100 MCG/2ML IJ SOLN
INTRAMUSCULAR | Status: AC
Start: 2017-09-29 — End: ?
  Filled 2017-09-29: qty 2

## 2017-09-29 MED ORDER — PHENYLEPHRINE 40 MCG/ML (10ML) SYRINGE FOR IV PUSH (FOR BLOOD PRESSURE SUPPORT)
PREFILLED_SYRINGE | INTRAVENOUS | Status: AC
  Filled 2017-09-29: qty 10

## 2017-09-29 MED ORDER — LIDOCAINE 2% (20 MG/ML) 5 ML SYRINGE
INTRAMUSCULAR | Status: DC | PRN
  Administered 2017-09-29: 40 mg via INTRAVENOUS

## 2017-09-29 MED ORDER — CHLORHEXIDINE GLUCONATE CLOTH 2 % EX PADS: 6.0000 | MEDICATED_PAD | Freq: Once | CUTANEOUS | Status: DC

## 2017-09-29 MED ORDER — ACETAMINOPHEN 500 MG PO TABS
1000.0000 mg | ORAL_TABLET | ORAL | Status: AC
  Administered 2017-09-29: 1000 mg via ORAL
  Filled 2017-09-29: qty 2

## 2017-09-29 MED ORDER — MIDAZOLAM HCL 2 MG/2ML IJ SOLN
INTRAMUSCULAR | Status: AC
  Filled 2017-09-29: qty 2

## 2017-09-29 MED ORDER — LIDOCAINE 2% (20 MG/ML) 5 ML SYRINGE
INTRAMUSCULAR | Status: DC | PRN
  Administered 2017-09-29: 1.5 mg/kg/h via INTRAVENOUS

## 2017-09-29 MED ORDER — ROCURONIUM BROMIDE 100 MG/10ML IV SOLN
INTRAVENOUS | Status: AC
  Filled 2017-09-29: qty 2

## 2017-09-29 MED ORDER — SODIUM CHLORIDE 0.9 % IV SOLN
2.0000 g | INTRAVENOUS | Status: AC
  Administered 2017-09-29: 2 g via INTRAVENOUS
  Filled 2017-09-29: qty 2

## 2017-09-29 MED ORDER — FENTANYL CITRATE (PF) 100 MCG/2ML IJ SOLN
INTRAMUSCULAR | Status: DC | PRN
  Administered 2017-09-29 (×3): 50 ug via INTRAVENOUS
  Administered 2017-09-29: 25 ug via INTRAVENOUS
  Administered 2017-09-29 (×2): 50 ug via INTRAVENOUS
  Administered 2017-09-29: 25 ug via INTRAVENOUS

## 2017-09-29 MED ORDER — PROPOFOL 10 MG/ML IV BOLUS
INTRAVENOUS | Status: DC | PRN
  Administered 2017-09-29: 100 mg via INTRAVENOUS
  Administered 2017-09-29: 30 mg via INTRAVENOUS

## 2017-09-29 MED ORDER — SODIUM CHLORIDE 0.9 % IV SOLN
INTRAVENOUS | Status: DC | PRN
  Administered 2017-09-29: 5 ug/min via INTRAVENOUS

## 2017-09-29 MED ORDER — ONDANSETRON HCL 4 MG/2ML IJ SOLN
INTRAMUSCULAR | Status: DC | PRN
  Administered 2017-09-29: 4 mg via INTRAVENOUS

## 2017-09-29 MED ORDER — SODIUM CHLORIDE 0.9 % IV SOLN
2.0000 g | Freq: Two times a day (BID) | INTRAVENOUS | Status: AC
  Administered 2017-09-29: 2 g via INTRAVENOUS
  Filled 2017-09-29: qty 2

## 2017-09-29 MED ORDER — PROMETHAZINE HCL 25 MG/ML IJ SOLN: 6.2500 mg | INTRAMUSCULAR | Status: DC | PRN

## 2017-09-29 MED ORDER — ROCURONIUM BROMIDE 10 MG/ML (PF) SYRINGE
PREFILLED_SYRINGE | INTRAVENOUS | Status: DC | PRN
  Administered 2017-09-29: 5 mg via INTRAVENOUS
  Administered 2017-09-29: 30 mg via INTRAVENOUS

## 2017-09-29 MED ORDER — SODIUM CHLORIDE 0.9 % IJ SOLN
INTRAMUSCULAR | Status: AC
  Filled 2017-09-29: qty 10

## 2017-09-29 MED ORDER — ONDANSETRON HCL 4 MG/2ML IJ SOLN
4.0000 mg | Freq: Four times a day (QID) | INTRAMUSCULAR | Status: DC | PRN
  Administered 2017-09-30: 4 mg via INTRAVENOUS
  Filled 2017-09-29: qty 2

## 2017-09-29 MED ORDER — SODIUM CHLORIDE 0.9 % IJ SOLN
INTRAMUSCULAR | Status: DC | PRN
  Administered 2017-09-29: 10 mL via INTRAVENOUS

## 2017-09-29 MED ORDER — HYDROCODONE-ACETAMINOPHEN 5-325 MG PO TABS
1.0000 | ORAL_TABLET | ORAL | Status: DC | PRN
  Administered 2017-09-29: 2 via ORAL
  Administered 2017-09-30 – 2017-10-02 (×5): 1 via ORAL
  Filled 2017-09-29 (×2): qty 1
  Filled 2017-09-29: qty 2
  Filled 2017-09-29 (×4): qty 1

## 2017-09-29 MED ORDER — BUPIVACAINE LIPOSOME 1.3 % IJ SUSP
INTRAMUSCULAR | Status: DC | PRN
  Administered 2017-09-29: 20 mL

## 2017-09-29 MED ORDER — KCL IN DEXTROSE-NACL 20-5-0.45 MEQ/L-%-% IV SOLN
INTRAVENOUS | Status: DC
  Administered 2017-09-29 – 2017-10-03 (×7): via INTRAVENOUS
  Filled 2017-09-29 (×8): qty 1000

## 2017-09-29 MED ORDER — INSULIN ASPART 100 UNIT/ML ~~LOC~~ SOLN
0.0000 [IU] | SUBCUTANEOUS | Status: DC
  Administered 2017-09-29: 3 [IU] via SUBCUTANEOUS
  Administered 2017-09-29: 2 [IU] via SUBCUTANEOUS
  Administered 2017-09-30 (×2): 1 [IU] via SUBCUTANEOUS
  Administered 2017-09-30 – 2017-10-01 (×2): 2 [IU] via SUBCUTANEOUS
  Administered 2017-10-01 (×2): 1 [IU] via SUBCUTANEOUS
  Administered 2017-10-01 – 2017-10-02 (×2): 2 [IU] via SUBCUTANEOUS
  Administered 2017-10-02: 1 [IU] via SUBCUTANEOUS

## 2017-09-29 MED ORDER — LACTATED RINGERS IR SOLN
Status: DC | PRN
  Administered 2017-09-29: 1000 mL

## 2017-09-29 MED ORDER — ALVIMOPAN 12 MG PO CAPS
12.0000 mg | ORAL_CAPSULE | Freq: Two times a day (BID) | ORAL | Status: DC
  Administered 2017-09-30 – 2017-10-01 (×3): 12 mg via ORAL
  Filled 2017-09-29 (×4): qty 1

## 2017-09-29 MED ORDER — ONDANSETRON HCL 4 MG PO TABS: 4.0000 mg | ORAL_TABLET | Freq: Four times a day (QID) | ORAL | Status: DC | PRN

## 2017-09-29 MED ORDER — GABAPENTIN 300 MG PO CAPS
300.0000 mg | ORAL_CAPSULE | Freq: Two times a day (BID) | ORAL | Status: DC
  Administered 2017-09-29 – 2017-10-03 (×9): 300 mg via ORAL
  Filled 2017-09-29 (×10): qty 1

## 2017-09-29 MED ORDER — EPHEDRINE 5 MG/ML INJ
INTRAVENOUS | Status: AC
  Filled 2017-09-29: qty 10

## 2017-09-29 MED ORDER — HEPARIN SODIUM (PORCINE) 5000 UNIT/ML IJ SOLN
5000.0000 [IU] | Freq: Three times a day (TID) | INTRAMUSCULAR | Status: DC
  Administered 2017-09-30 – 2017-10-03 (×11): 5000 [IU] via SUBCUTANEOUS
  Filled 2017-09-29 (×12): qty 1

## 2017-09-29 MED ORDER — SUCCINYLCHOLINE CHLORIDE 200 MG/10ML IV SOSY
PREFILLED_SYRINGE | INTRAVENOUS | Status: AC
  Filled 2017-09-29: qty 20

## 2017-09-29 MED ORDER — DEXAMETHASONE SODIUM PHOSPHATE 10 MG/ML IJ SOLN
INTRAMUSCULAR | Status: DC | PRN
  Administered 2017-09-29: 6 mg via INTRAVENOUS

## 2017-09-29 MED ORDER — METOPROLOL TARTRATE 5 MG/5ML IV SOLN
5.0000 mg | Freq: Four times a day (QID) | INTRAVENOUS | Status: DC | PRN
  Administered 2017-10-01: 5 mg via INTRAVENOUS
  Filled 2017-09-29: qty 5

## 2017-09-29 MED ORDER — DEXAMETHASONE SODIUM PHOSPHATE 10 MG/ML IJ SOLN
INTRAMUSCULAR | Status: AC
  Filled 2017-09-29: qty 1

## 2017-09-29 MED ORDER — LIDOCAINE 2% (20 MG/ML) 5 ML SYRINGE
INTRAMUSCULAR | Status: AC
  Filled 2017-09-29: qty 15

## 2017-09-29 MED ORDER — PROPOFOL 10 MG/ML IV BOLUS
INTRAVENOUS | Status: AC
  Filled 2017-09-29: qty 20

## 2017-09-29 MED ORDER — GABAPENTIN 300 MG PO CAPS
300.0000 mg | ORAL_CAPSULE | ORAL | Status: AC
  Administered 2017-09-29: 300 mg via ORAL
  Filled 2017-09-29: qty 1

## 2017-09-29 MED ORDER — HYDROMORPHONE HCL 1 MG/ML IJ SOLN: 0.2500 mg | INTRAMUSCULAR | Status: DC | PRN

## 2017-09-29 MED ORDER — ONDANSETRON HCL 4 MG/2ML IJ SOLN
INTRAMUSCULAR | Status: AC
  Filled 2017-09-29: qty 2

## 2017-09-29 MED ORDER — FENTANYL CITRATE (PF) 100 MCG/2ML IJ SOLN
INTRAMUSCULAR | Status: AC
  Filled 2017-09-29: qty 2

## 2017-09-29 MED ORDER — SUGAMMADEX SODIUM 200 MG/2ML IV SOLN
INTRAVENOUS | Status: AC
  Filled 2017-09-29: qty 2

## 2017-09-29 MED ORDER — SUCCINYLCHOLINE CHLORIDE 200 MG/10ML IV SOSY
PREFILLED_SYRINGE | INTRAVENOUS | Status: DC | PRN
  Administered 2017-09-29: 100 mg via INTRAVENOUS

## 2017-09-29 MED ORDER — ALVIMOPAN 12 MG PO CAPS
12.0000 mg | ORAL_CAPSULE | ORAL | Status: AC
  Administered 2017-09-29: 12 mg via ORAL
  Filled 2017-09-29: qty 1

## 2017-09-29 MED ORDER — ACETAMINOPHEN 10 MG/ML IV SOLN: 1000.0000 mg | Freq: Once | INTRAVENOUS | Status: DC | PRN

## 2017-09-29 MED ORDER — FENTANYL CITRATE (PF) 100 MCG/2ML IJ SOLN: 25.0000 ug | INTRAMUSCULAR | Status: DC | PRN

## 2017-09-29 MED ORDER — FENTANYL CITRATE (PF) 250 MCG/5ML IJ SOLN
INTRAMUSCULAR | Status: AC
  Filled 2017-09-29: qty 5

## 2017-09-29 MED ORDER — LACTATED RINGERS IV SOLN
INTRAVENOUS | Status: DC
  Administered 2017-09-29 (×2): via INTRAVENOUS

## 2017-09-29 MED ORDER — HYDRALAZINE HCL 20 MG/ML IJ SOLN
10.0000 mg | INTRAMUSCULAR | Status: DC | PRN
  Administered 2017-10-01: 10 mg via INTRAVENOUS
  Filled 2017-09-29: qty 1

## 2017-09-29 SURGICAL SUPPLY — 67 items
APPLIER CLIP 5 13 M/L LIGAMAX5 (MISCELLANEOUS)
APPLIER CLIP ROT 10 11.4 M/L (STAPLE)
APR CLP MED LRG 11.4X10 (STAPLE)
APR CLP MED LRG 5 ANG JAW (MISCELLANEOUS)
BINDER ABDOMINAL 12 ML 46-62 (SOFTGOODS) ×2 IMPLANT
BLADE EXTENDED COATED 6.5IN (ELECTRODE) IMPLANT
CABLE HIGH FREQUENCY MONO STRZ (ELECTRODE) ×3 IMPLANT
CELLS DAT CNTRL 66122 CELL SVR (MISCELLANEOUS) IMPLANT
CHLORAPREP W/TINT 26ML (MISCELLANEOUS) ×1 IMPLANT
CLIP APPLIE 5 13 M/L LIGAMAX5 (MISCELLANEOUS) IMPLANT
CLIP APPLIE ROT 10 11.4 M/L (STAPLE) IMPLANT
COUNTER NEEDLE 20 DBL MAG RED (NEEDLE) ×1 IMPLANT
COVER MAYO STAND STRL (DRAPES) ×3 IMPLANT
COVER SURGICAL LIGHT HANDLE (MISCELLANEOUS) ×5 IMPLANT
DECANTER SPIKE VIAL GLASS SM (MISCELLANEOUS) ×3 IMPLANT
DRAPE LAPAROSCOPIC ABDOMINAL (DRAPES) ×1 IMPLANT
DRSG OPSITE POSTOP 4X10 (GAUZE/BANDAGES/DRESSINGS) IMPLANT
DRSG OPSITE POSTOP 4X6 (GAUZE/BANDAGES/DRESSINGS) ×2 IMPLANT
DRSG OPSITE POSTOP 4X8 (GAUZE/BANDAGES/DRESSINGS) IMPLANT
ELECT REM PT RETURN 15FT ADLT (MISCELLANEOUS) ×3 IMPLANT
GAUZE SPONGE 2X2 8PLY STRL LF (GAUZE/BANDAGES/DRESSINGS) IMPLANT
GAUZE SPONGE 4X4 12PLY STRL (GAUZE/BANDAGES/DRESSINGS) IMPLANT
GAUZE SPONGE 4X4 16PLY XRAY LF (GAUZE/BANDAGES/DRESSINGS) ×2 IMPLANT
GLOVE BIOGEL M 8.0 STRL (GLOVE) ×6 IMPLANT
GOWN STRL REUS W/TWL XL LVL3 (GOWN DISPOSABLE) ×22 IMPLANT
HANDLE STAPLE EGIA 4 XL (STAPLE) ×2 IMPLANT
LEGGING LITHOTOMY PAIR STRL (DRAPES) IMPLANT
PACK COLON (CUSTOM PROCEDURE TRAY) ×3 IMPLANT
PAD POSITIONING PINK XL (MISCELLANEOUS) ×3 IMPLANT
PORT LAP GEL ALEXIS MED 5-9CM (MISCELLANEOUS) IMPLANT
POSITIONER SURGICAL ARM (MISCELLANEOUS) ×4 IMPLANT
RELOAD EGIA 60 MED/THCK PURPLE (STAPLE) ×6 IMPLANT
RELOAD EGIA 60 TAN VASC (STAPLE) ×2 IMPLANT
RELOAD STAPLE 60 MED/THCK ART (STAPLE) IMPLANT
RETRACTOR WND ALEXIS 18 MED (MISCELLANEOUS) IMPLANT
RTRCTR WOUND ALEXIS 18CM MED (MISCELLANEOUS)
SCISSORS LAP 5X45 EPIX DISP (ENDOMECHANICALS) ×3 IMPLANT
SET IRRIG TUBING LAPAROSCOPIC (IRRIGATION / IRRIGATOR) ×3 IMPLANT
SLEEVE XCEL OPT CAN 5 100 (ENDOMECHANICALS) ×8 IMPLANT
SPONGE GAUZE 2X2 STER 10/PKG (GAUZE/BANDAGES/DRESSINGS) ×2
STAPLER VISISTAT 35W (STAPLE) ×1 IMPLANT
SUT CHROMIC 3 0 SH 27 (SUTURE) IMPLANT
SUT NOVA NAB DX-16 0-1 5-0 T12 (SUTURE) ×4 IMPLANT
SUT PDS AB 1 CTX 36 (SUTURE) IMPLANT
SUT PDS AB 1 TP1 96 (SUTURE) IMPLANT
SUT PDS AB 4-0 SH 27 (SUTURE) ×4 IMPLANT
SUT PROLENE 2 0 KS (SUTURE) IMPLANT
SUT SILK 2 0 (SUTURE) ×3
SUT SILK 2 0 SH CR/8 (SUTURE) ×5 IMPLANT
SUT SILK 2-0 18XBRD TIE 12 (SUTURE) ×1 IMPLANT
SUT SILK 3 0 (SUTURE) ×3
SUT SILK 3 0 SH CR/8 (SUTURE) ×5 IMPLANT
SUT SILK 3-0 18XBRD TIE 12 (SUTURE) ×1 IMPLANT
SUT VIC AB 2-0 SH 27 (SUTURE) ×3
SUT VIC AB 2-0 SH 27X BRD (SUTURE) IMPLANT
SUT VIC AB 4-0 SH 18 (SUTURE) ×3 IMPLANT
SYS LAPSCP GELPORT 120MM (MISCELLANEOUS) ×3
SYSTEM LAPSCP GELPORT 120MM (MISCELLANEOUS) IMPLANT
TAPE CLOTH SURG 4X10 WHT LF (GAUZE/BANDAGES/DRESSINGS) ×2 IMPLANT
TOWEL OR NON WOVEN STRL DISP B (DISPOSABLE) ×1 IMPLANT
TRAY FOLEY MTR SLVR 14FR STAT (SET/KITS/TRAYS/PACK) ×2 IMPLANT
TRAY FOLEY MTR SLVR 16FR STAT (SET/KITS/TRAYS/PACK) IMPLANT
TROCAR BLADELESS OPT 5 100 (ENDOMECHANICALS) ×3 IMPLANT
TROCAR XCEL NON-BLD 11X100MML (ENDOMECHANICALS) IMPLANT
TUBING CONNECTING 10 (TUBING) ×2 IMPLANT
TUBING CONNECTING 10' (TUBING) ×2
TUBING INSUF HEATED (TUBING) ×3 IMPLANT

## 2017-09-29 NOTE — Anesthesia Procedure Notes (Signed)
Date/Time: 09/29/2017 12:19 PM Performed by: Cynda Familia, CRNA Oxygen Delivery Method: Simple face mask Placement Confirmation: positive ETCO2 and breath sounds checked- equal and bilateral Dental Injury: Teeth and Oropharynx as per pre-operative assessment

## 2017-09-29 NOTE — Anesthesia Preprocedure Evaluation (Addendum)
Anesthesia Evaluation  Patient identified by MRN, date of birth, ID band Patient awake    Reviewed: Allergy & Precautions, NPO status , Patient's Chart, lab work & pertinent test results  Airway Mallampati: II  TM Distance: >3 FB Neck ROM: Full    Dental no notable dental hx. (+) Dental Advisory Given, Chipped, Partial Upper   Pulmonary neg pulmonary ROS, former smoker,    Pulmonary exam normal breath sounds clear to auscultation       Cardiovascular hypertension, Normal cardiovascular exam Rhythm:Regular Rate:Normal     Neuro/Psych negative neurological ROS  negative psych ROS   GI/Hepatic Neg liver ROS, GERD  ,  Endo/Other  diabetesHypothyroidism   Renal/GU negative Renal ROS  negative genitourinary   Musculoskeletal negative musculoskeletal ROS (+)   Abdominal   Peds negative pediatric ROS (+)  Hematology  (+) anemia ,   Anesthesia Other Findings   Reproductive/Obstetrics negative OB ROS                            Anesthesia Physical Anesthesia Plan  ASA: III  Anesthesia Plan: General   Post-op Pain Management:    Induction: Intravenous  PONV Risk Score and Plan: 3 and Ondansetron, Dexamethasone and Treatment may vary due to age or medical condition  Airway Management Planned: Oral ETT  Additional Equipment:   Intra-op Plan:   Post-operative Plan: Extubation in OR  Informed Consent: I have reviewed the patients History and Physical, chart, labs and discussed the procedure including the risks, benefits and alternatives for the proposed anesthesia with the patient or authorized representative who has indicated his/her understanding and acceptance.   Dental advisory given  Plan Discussed with: CRNA and Surgeon  Anesthesia Plan Comments:         Anesthesia Quick Evaluation

## 2017-09-29 NOTE — Interval H&P Note (Signed)
History and Physical Interval Note:  09/29/2017 9:16 AM  Rebecca Sellers  has presented today for surgery, with the diagnosis of Colon cancer  The various methods of treatment have been discussed with the patient and family. After consideration of risks, benefits and other options for treatment, the patient has consented to  Procedure(s): LAPAROSCOPIC PARTIAL COLECTOMY (N/A) as a surgical intervention .  The patient's history has been reviewed, patient examined, no change in status, stable for surgery.  I have reviewed the patient's chart and labs.  Questions were answered to the patient's satisfaction.     Pedro Earls

## 2017-09-29 NOTE — Op Note (Signed)
SULEIKA DONAVAN  1931/01/21 29 September 2017    PCP:  Shon Baton, MD   Surgeon: Kaylyn Lim, MD, FACS  Asst:  Ralene Ok, MD, FACS  Anes:  general  Preop Dx: Cancer of the ascending colon Postop Dx: same  Procedure: Lap assisted right hemicolectomy with primary anastomosis  Location Surgery: [facility] # 8 Complications: none  EBL:   50 cc  Drains: none  Description of Procedure:  The patient was taken to OR 8 .  After anesthesia was administered and the patient was prepped  with Technicare and a timeout was performed.  Access achieved with a 5 mm Optiview technique.  Following this there were 2 other 5 mm placed in the midline and 104 on the right side.  The right colon was mobilized in the 2 tattooed areas were noted.  The lesion appeared to be in the ascending colon as described.  The mobilization was completed and the duodenum was seen.  A midline incision was made but slightly below the umbilicus and carried above the umbilicus and the wound protector was inserted.  The right colon mobilization was completed by as I had to mobilize more of the terminal ileum.  Side to side anastomosis was created using the Covidien 6 cm endoscopic stapler using a brown load to transect the terminal ileum and a purple load to transect the transverse colon.  The mesentery was divided with harmonic scalpel and the vessels were oversewn with suture ligatures of 2-0 silk.  Mesenteric defect was closed and the side-to-side anastomosis was performed using a purple load.  The common defect was closed in 2 layers with 4-0 PDS canal fashion on the inside and interrupted 3 oh silks in the outside.  : Protocol was full was followed in equipment was changed out.  Peritoneum was closed with running 2-0 Vicryl.  The preperitoneal layer was injected with Exparel and then closed with inverted #1 Novafil's.  Wound was irrigated and closed with 4-0 Vicryl as well as staples.  I reinflated with the gas and with the camera  and sucked out a little bit of collection in the right upper quadrant.  Everything else appeared to be in order look good.  Abdomen was deflated port sites were removed closed with 4-0 Vicryl and with staples.  The patient tolerated the procedure well and was taken to the PACU in stable condition.     Matt B. Hassell Done, [place], [facility], [place]

## 2017-09-29 NOTE — Anesthesia Procedure Notes (Signed)
Procedure Name: Intubation Date/Time: 09/29/2017 9:36 AM Performed by: Cynda Familia, CRNA Pre-anesthesia Checklist: Patient identified, Emergency Drugs available, Suction available and Patient being monitored Patient Re-evaluated:Patient Re-evaluated prior to induction Oxygen Delivery Method: Circle System Utilized Preoxygenation: Pre-oxygenation with 100% oxygen Induction Type: IV induction Ventilation: Mask ventilation without difficulty Laryngoscope Size: Miller and 2 Grade View: Grade I Tube type: Oral Number of attempts: 1 Airway Equipment and Method: Stylet Placement Confirmation: ETT inserted through vocal cords under direct vision,  positive ETCO2 and breath sounds checked- equal and bilateral Secured at: 22 cm Tube secured with: Tape Dental Injury: Teeth and Oropharynx as per pre-operative assessment  Comments: Smooth IV induction Rose-- intubation AM CRNA atraumatic-- teeth and mouth as preop-- slight chipping on front and lower front teeth- unchanged with laryngoscopy-- bilat BS Rose

## 2017-09-29 NOTE — Transfer of Care (Signed)
Immediate Anesthesia Transfer of Care Note  Patient: Rebecca Sellers  Procedure(s) Performed: LAPAROSCOPIC PARTIAL COLECTOMY (N/A Abdomen)  Patient Location: PACU  Anesthesia Type:General  Level of Consciousness: sedated  Airway & Oxygen Therapy: Patient Spontanous Breathing and Patient connected to face mask oxygen  Post-op Assessment: Report given to RN and Post -op Vital signs reviewed and stable  Post vital signs: Reviewed and stable  Last Vitals:  Vitals Value Taken Time  BP 185/74 09/29/2017 12:30 PM  Temp    Pulse 81 09/29/2017 12:32 PM  Resp 20 09/29/2017 12:32 PM  SpO2 100 % 09/29/2017 12:32 PM  Vitals shown include unvalidated device data.  Last Pain:  Vitals:   09/29/17 0714  TempSrc: Oral  PainSc: 0-No pain         Complications: No apparent anesthesia complications

## 2017-09-29 NOTE — Anesthesia Postprocedure Evaluation (Signed)
Anesthesia Post Note  Patient: Rebecca Sellers  Procedure(s) Performed: LAPAROSCOPIC PARTIAL COLECTOMY (N/A Abdomen)     Patient location during evaluation: PACU Anesthesia Type: General Level of consciousness: awake and alert Pain management: pain level controlled Vital Signs Assessment: post-procedure vital signs reviewed and stable Respiratory status: spontaneous breathing, nonlabored ventilation, respiratory function stable and patient connected to nasal cannula oxygen Cardiovascular status: blood pressure returned to baseline and stable Postop Assessment: no apparent nausea or vomiting Anesthetic complications: no    Last Vitals:  Vitals:   09/29/17 1245 09/29/17 1300  BP: (!) 184/77 (!) 180/78  Pulse: 81 73  Resp: 20 13  Temp:    SpO2: 100% 100%    Last Pain:  Vitals:   09/29/17 1330  TempSrc:   PainSc: 0-No pain                 Kory Rains S

## 2017-09-30 ENCOUNTER — Encounter (HOSPITAL_COMMUNITY): Payer: Self-pay | Admitting: Surgery

## 2017-09-30 LAB — CBC
HCT: 27.6 % — ABNORMAL LOW (ref 36.0–46.0)
Hemoglobin: 8.9 g/dL — ABNORMAL LOW (ref 12.0–15.0)
MCH: 27.3 pg (ref 26.0–34.0)
MCHC: 32.2 g/dL (ref 30.0–36.0)
MCV: 84.7 fL (ref 78.0–100.0)
PLATELETS: 255 10*3/uL (ref 150–400)
RBC: 3.26 MIL/uL — AB (ref 3.87–5.11)
RDW: 13.1 % (ref 11.5–15.5)
WBC: 7.9 10*3/uL (ref 4.0–10.5)

## 2017-09-30 LAB — GLUCOSE, CAPILLARY
GLUCOSE-CAPILLARY: 101 mg/dL — AB (ref 70–99)
GLUCOSE-CAPILLARY: 116 mg/dL — AB (ref 70–99)
GLUCOSE-CAPILLARY: 141 mg/dL — AB (ref 70–99)
GLUCOSE-CAPILLARY: 170 mg/dL — AB (ref 70–99)
Glucose-Capillary: 114 mg/dL — ABNORMAL HIGH (ref 70–99)
Glucose-Capillary: 146 mg/dL — ABNORMAL HIGH (ref 70–99)
Glucose-Capillary: 108 mg/dL — ABNORMAL HIGH (ref 70–99)

## 2017-09-30 LAB — BASIC METABOLIC PANEL
Anion gap: 7 (ref 5–15)
BUN: 11 mg/dL (ref 8–23)
CALCIUM: 9.3 mg/dL (ref 8.9–10.3)
CO2: 26 mmol/L (ref 22–32)
CREATININE: 1.25 mg/dL — AB (ref 0.44–1.00)
Chloride: 101 mmol/L (ref 98–111)
GFR, EST AFRICAN AMERICAN: 44 mL/min — AB (ref >60)
GFR, EST NON AFRICAN AMERICAN: 38 mL/min — AB (ref >60)
Glucose, Bld: 133 mg/dL — ABNORMAL HIGH (ref 70–99)
Potassium: 4.6 mmol/L (ref 3.5–5.1)
SODIUM: 134 mmol/L — AB (ref 135–145)

## 2017-09-30 NOTE — Progress Notes (Signed)
Patient ID: Rebecca Sellers, female   DOB: Aug 11, 1930, 84 y.o.   MRN: [id number]  [facility] Progress Note:   1 Day Post-Op  Subjective: Mental status is clear Objective: Vital signs in last 24 hours: Temp:  [97.7 F (36.5 C)-98.7 F (37.1 C)] 98.6 F (37 C) (08/27 1413) Pulse Rate:  [77-102] 99 (08/27 1413) Resp:  [15-18] 16 (08/27 1413) BP: (145-186)/(67-80) 177/73 (08/27 1413) SpO2:  [96 %-100 %] 96 % (08/27 1413) Weight:  [62.4 kg] 62.4 kg (08/27 1100)  Intake/Output from previous day: 08/26 0701 - 08/27 0700 In: 3678.8 [P.O.:60; I.V.:3518.8; IV Piggyback:100] Out: 2035 [Urine:4325; Blood:50] Intake/Output this shift: Total I/O In: 739.5 [I.V.:739.5] Out: 650 [Urine:650]  Physical Exam: Work of breathing is normal.  Minimal abdominal pain  Lab Results:  Results for orders placed or performed during the hospital encounter of 09/29/17 (from the past 48 hour(s))  Glucose, capillary     Status: Abnormal   Collection Time: 09/29/17  7:12 AM  Result Value Ref Range   Glucose-Capillary 140 (H) 70 - 99 mg/dL   Comment 1 Notify RN    Comment 2 Document in Chart   Type and screen [place]     Status: None   Collection Time: 09/29/17  8:23 AM  Result Value Ref Range   ABO/RH(D) O NEG    Antibody Screen NEG    Sample Expiration      10/02/2017 Performed at [facility], [place] [address] [place], [place] [address]   ABO/Rh     Status: None   Collection Time: 09/29/17  8:23 AM  Result Value Ref Range   ABO/RH(D)      Jenetta Downer NEG Performed at [facility] [address] [place], [place] [address]   Glucose, capillary     Status: Abnormal   Collection Time: 09/29/17 12:49 PM  Result Value Ref Range   Glucose-Capillary 189 (H) 70 - 99 mg/dL  CBC     Status: Abnormal   Collection Time: 09/29/17  2:39 PM  Result Value Ref Range   WBC 10.2 4.0 - 10.5 K/uL   RBC 3.50 (L) 3.87 - 5.11 MIL/uL   Hemoglobin 9.3  (L) 12.0 - 15.0 g/dL   HCT 29.7 (L) 36.0 - 46.0 %   MCV 84.9 78.0 - 100.0 fL   MCH 26.6 26.0 - 34.0 pg   MCHC 31.3 30.0 - 36.0 g/dL   RDW 13.1 11.5 - 15.5 %   Platelets 265 150 - 400 K/uL    Comment: Performed at [facility], [place] [address], [place], [place] [address]  Creatinine, serum     Status: Abnormal   Collection Time: 09/29/17  2:39 PM  Result Value Ref Range   Creatinine, Ser 1.55 (H) 0.44 - 1.00 mg/dL   GFR calc non Af Amer 29 (L) >60 mL/min   GFR calc Af Amer 34 (L) >60 mL/min    Comment: (NOTE) The eGFR has been calculated using the CKD EPI equation. This calculation has not been validated in all clinical situations. eGFR's persistently <60 mL/min signify possible Chronic Kidney Disease. Performed at [facility], [place] [address], [place], [place] [address]   Glucose, capillary     Status: Abnormal   Collection Time: 09/29/17  4:17 PM  Result Value Ref Range   Glucose-Capillary 202 (H) 70 - 99 mg/dL  Glucose, capillary     Status: Abnormal   Collection Time: 09/29/17  8:03  PM  Result Value Ref Range   Glucose-Capillary 194 (H) 70 - 99 mg/dL  Glucose, capillary     Status: Abnormal   Collection Time: 09/30/17 12:19 AM  Result Value Ref Range   Glucose-Capillary 170 (H) 70 - 99 mg/dL  Glucose, capillary     Status: Abnormal   Collection Time: 09/30/17  4:19 AM  Result Value Ref Range   Glucose-Capillary 141 (H) 70 - 99 mg/dL  Basic metabolic panel     Status: Abnormal   Collection Time: 09/30/17  5:24 AM  Result Value Ref Range   Sodium 134 (L) 135 - 145 mmol/L   Potassium 4.6 3.5 - 5.1 mmol/L   Chloride 101 98 - 111 mmol/L   CO2 26 22 - 32 mmol/L   Glucose, Bld 133 (H) 70 - 99 mg/dL   BUN 11 8 - 23 mg/dL   Creatinine, Ser 1.25 (H) 0.44 - 1.00 mg/dL   Calcium 9.3 8.9 - 10.3 mg/dL   GFR calc non Af Amer 38 (L) >60 mL/min   GFR calc Af Amer 44 (L) >60 mL/min    Comment: (NOTE) The eGFR has been calculated using the CKD  EPI equation. This calculation has not been validated in all clinical situations. eGFR's persistently <60 mL/min signify possible Chronic Kidney Disease.    Anion gap 7 5 - 15    Comment: Performed at [facility], [place] [address] [place], [place] [address]  CBC     Status: Abnormal   Collection Time: 09/30/17  5:24 AM  Result Value Ref Range   WBC 7.9 4.0 - 10.5 K/uL   RBC 3.26 (L) 3.87 - 5.11 MIL/uL   Hemoglobin 8.9 (L) 12.0 - 15.0 g/dL   HCT 27.6 (L) 36.0 - 46.0 %   MCV 84.7 78.0 - 100.0 fL   MCH 27.3 26.0 - 34.0 pg   MCHC 32.2 30.0 - 36.0 g/dL   RDW 13.1 11.5 - 15.5 %   Platelets 255 150 - 400 K/uL    Comment: Performed at [facility], [place] [address] [place], [place] [address]  Glucose, capillary     Status: Abnormal   Collection Time: 09/30/17  7:40 AM  Result Value Ref Range   Glucose-Capillary 101 (H) 70 - 99 mg/dL  Glucose, capillary     Status: Abnormal   Collection Time: 09/30/17 12:01 PM  Result Value Ref Range   Glucose-Capillary 114 (H) 70 - 99 mg/dL    Radiology/Results: No results found.  Anti-infectives: Anti-infectives (From admission, onward)   Start     Dose/Rate Route Frequency Ordered Stop   09/29/17 2100  cefoTEtan (CEFOTAN) 2 g in sodium chloride 0.9 % 100 mL IVPB     2 g 200 mL/hr over 30 Minutes Intravenous Every 12 hours 09/29/17 1409 09/29/17 2218   09/29/17 0745  cefoTEtan (CEFOTAN) 2 g in sodium chloride 0.9 % 100 mL IVPB     2 g 200 mL/hr over 30 Minutes Intravenous On call to O.R. 09/29/17 0730 09/29/17 9935      Assessment/Plan: Problem List: Patient Active Problem List   Diagnosis Date Noted  . S/P right colectomy August 2019 09/29/2017  . Carpal tunnel syndrome 02/20/2015    Will begin clears this afternoon 1 Day Post-Op    LOS: 1 day   Matt B. Hassell Done, MD, [facility] [phone] beeper [phone]  09/30/2017 2:34 PM

## 2017-10-01 LAB — GLUCOSE, CAPILLARY
GLUCOSE-CAPILLARY: 137 mg/dL — AB (ref 70–99)
GLUCOSE-CAPILLARY: 187 mg/dL — AB (ref 70–99)
Glucose-Capillary: 121 mg/dL — ABNORMAL HIGH (ref 70–99)
Glucose-Capillary: 115 mg/dL — ABNORMAL HIGH (ref 70–99)
Glucose-Capillary: 127 mg/dL — ABNORMAL HIGH (ref 70–99)
Glucose-Capillary: 151 mg/dL — ABNORMAL HIGH (ref 70–99)

## 2017-10-01 MED ORDER — LISINOPRIL 20 MG PO TABS
40.0000 mg | ORAL_TABLET | Freq: Every day | ORAL | Status: DC
  Administered 2017-10-01 – 2017-10-03 (×3): 40 mg via ORAL
  Filled 2017-10-01 (×3): qty 2

## 2017-10-01 MED ORDER — AMLODIPINE BESYLATE 5 MG PO TABS
2.5000 mg | ORAL_TABLET | Freq: Every day | ORAL | Status: DC
  Administered 2017-10-01 – 2017-10-03 (×3): 2.5 mg via ORAL
  Filled 2017-10-01 (×3): qty 1

## 2017-10-01 MED ORDER — PANTOPRAZOLE SODIUM 40 MG PO TBEC
40.0000 mg | DELAYED_RELEASE_TABLET | Freq: Every day | ORAL | Status: DC
  Administered 2017-10-01 – 2017-10-03 (×3): 40 mg via ORAL
  Filled 2017-10-01 (×3): qty 1

## 2017-10-01 NOTE — Progress Notes (Signed)
Pharmacy Brief Note - Alvimopan (Entereg)  The standing order set for alvimopan (Entereg) now includes an automatic order to discontinue the drug after the patient has had a bowel movement. The change was approved by the [facility] and the Medical Executive Committee.   This patient has had bowel movements documented by nursing. Therefore, alvimopan has been discontinued. If there are questions, please contact the pharmacy at [phone].   Thank you-  Dia Sitter, PharmD, BCPS 10/01/2017 9:59 AM

## 2017-10-01 NOTE — Progress Notes (Signed)
Patient ID: Rebecca Sellers, female   DOB: 08/02/1930, 84 y.o.   MRN: [id number] [facility] Progress Note:   2 Days Post-Op  Subjective: Mental status is clear Objective: Vital signs in last 24 hours: Temp:  [98.1 F (36.7 C)-98.5 F (36.9 C)] 98.3 F (36.8 C) (08/28 1430) Pulse Rate:  [89-98] 98 (08/28 1430) Resp:  [16-18] 18 (08/28 1430) BP: (155-190)/(68-96) 190/80 (08/28 1545) SpO2:  [94 %-99 %] 96 % (08/28 1430)  Intake/Output from previous day: 08/27 0701 - 08/28 0700 In: 3206.1 [P.O.:960; I.V.:2246.1] Out: 2403 [Urine:2400; Stool:3] Intake/Output this shift: Total I/O In: 1279 [P.O.:660; I.V.:619] Out: 200 [Urine:200]  Physical Exam: Work of breathing is normal.  Incision OK  Lab Results:  Results for orders placed or performed during the hospital encounter of 09/29/17 (from the past 48 hour(s))  Glucose, capillary     Status: Abnormal   Collection Time: 09/29/17  4:17 PM  Result Value Ref Range   Glucose-Capillary 202 (H) 70 - 99 mg/dL  Glucose, capillary     Status: Abnormal   Collection Time: 09/29/17  8:03 PM  Result Value Ref Range   Glucose-Capillary 194 (H) 70 - 99 mg/dL  Glucose, capillary     Status: Abnormal   Collection Time: 09/30/17 12:19 AM  Result Value Ref Range   Glucose-Capillary 170 (H) 70 - 99 mg/dL  Glucose, capillary     Status: Abnormal   Collection Time: 09/30/17  4:19 AM  Result Value Ref Range   Glucose-Capillary 141 (H) 70 - 99 mg/dL  Basic metabolic panel     Status: Abnormal   Collection Time: 09/30/17  5:24 AM  Result Value Ref Range   Sodium 134 (L) 135 - 145 mmol/L   Potassium 4.6 3.5 - 5.1 mmol/L   Chloride 101 98 - 111 mmol/L   CO2 26 22 - 32 mmol/L   Glucose, Bld 133 (H) 70 - 99 mg/dL   BUN 11 8 - 23 mg/dL   Creatinine, Ser 1.25 (H) 0.44 - 1.00 mg/dL   Calcium 9.3 8.9 - 10.3 mg/dL   GFR calc non Af Amer 38 (L) >60 mL/min   GFR calc Af Amer 44 (L) >60 mL/min    Comment: (NOTE) The eGFR has been calculated  using the CKD EPI equation. This calculation has not been validated in all clinical situations. eGFR's persistently <60 mL/min signify possible Chronic Kidney Disease.    Anion gap 7 5 - 15    Comment: Performed at [facility], [address] [place], [place] [address]  CBC     Status: Abnormal   Collection Time: 09/30/17  5:24 AM  Result Value Ref Range   WBC 7.9 4.0 - 10.5 K/uL   RBC 3.26 (L) 3.87 - 5.11 MIL/uL   Hemoglobin 8.9 (L) 12.0 - 15.0 g/dL   HCT 27.6 (L) 36.0 - 46.0 %   MCV 84.7 78.0 - 100.0 fL   MCH 27.3 26.0 - 34.0 pg   MCHC 32.2 30.0 - 36.0 g/dL   RDW 13.1 11.5 - 15.5 %   Platelets 255 150 - 400 K/uL    Comment: Performed at [facility], [address] [place], [place] [address]  Glucose, capillary     Status: Abnormal   Collection Time: 09/30/17  7:40 AM  Result Value Ref Range   Glucose-Capillary 101 (H) 70 - 99 mg/dL  Glucose, capillary     Status: Abnormal   Collection Time: 09/30/17 12:01 PM    Result Value Ref Range   Glucose-Capillary 114 (H) 70 - 99 mg/dL  Glucose, capillary     Status: Abnormal   Collection Time: 09/30/17  3:55 PM  Result Value Ref Range   Glucose-Capillary 146 (H) 70 - 99 mg/dL  Glucose, capillary     Status: Abnormal   Collection Time: 09/30/17  7:53 PM  Result Value Ref Range   Glucose-Capillary 108 (H) 70 - 99 mg/dL  Glucose, capillary     Status: Abnormal   Collection Time: 09/30/17 11:47 PM  Result Value Ref Range   Glucose-Capillary 116 (H) 70 - 99 mg/dL  Glucose, capillary     Status: Abnormal   Collection Time: 10/01/17  4:02 AM  Result Value Ref Range   Glucose-Capillary 121 (H) 70 - 99 mg/dL  Glucose, capillary     Status: Abnormal   Collection Time: 10/01/17  7:48 AM  Result Value Ref Range   Glucose-Capillary 115 (H) 70 - 99 mg/dL  Glucose, capillary     Status: Abnormal   Collection Time: 10/01/17 11:34 AM  Result Value Ref Range   Glucose-Capillary 127 (H) 70 - 99 mg/dL   Glucose, capillary     Status: Abnormal   Collection Time: 10/01/17  3:25 PM  Result Value Ref Range   Glucose-Capillary 187 (H) 70 - 99 mg/dL    Radiology/Results: No results found.  Anti-infectives: Anti-infectives (From admission, onward)   Start     Dose/Rate Route Frequency Ordered Stop   09/29/17 2100  cefoTEtan (CEFOTAN) 2 g in sodium chloride 0.9 % 100 mL IVPB     2 g 200 mL/hr over 30 Minutes Intravenous Every 12 hours 09/29/17 1409 09/29/17 2218   09/29/17 0745  cefoTEtan (CEFOTAN) 2 g in sodium chloride 0.9 % 100 mL IVPB     2 g 200 mL/hr over 30 Minutes Intravenous On call to O.R. 09/29/17 0730 09/29/17 0388      Assessment/Plan: Problem List: Patient Active Problem List   Diagnosis Date Noted  . S/P right colectomy August 2019 09/29/2017  . Carpal tunnel syndrome 02/20/2015    Advance to full liquids as she has already had a BM.  Restart home BP meds. 2 Days Post-Op    LOS: 2 days   Matt B. Hassell Done, MD, [facility] [phone] beeper [phone]  10/01/2017 4:09 PM

## 2017-10-02 LAB — GLUCOSE, CAPILLARY
GLUCOSE-CAPILLARY: 116 mg/dL — AB (ref 70–99)
Glucose-Capillary: 91 mg/dL (ref 70–99)
Glucose-Capillary: 113 mg/dL — ABNORMAL HIGH (ref 70–99)
Glucose-Capillary: 135 mg/dL — ABNORMAL HIGH (ref 70–99)

## 2017-10-02 MED ORDER — INSULIN ASPART 100 UNIT/ML ~~LOC~~ SOLN
0.0000 [IU] | Freq: Three times a day (TID) | SUBCUTANEOUS | Status: DC
  Administered 2017-10-02: 1 [IU] via SUBCUTANEOUS

## 2017-10-02 NOTE — Progress Notes (Signed)
Clarified diet order with Dr. Hassell Done via phone. Also received orders to change frequency of CBG checks and insulin dose requirements.

## 2017-10-02 NOTE — Progress Notes (Signed)
"  I have reviewed and concur with with the student's documentation."

## 2017-10-02 NOTE — Care Management Important Message (Signed)
Important Message  Patient Details  Name: Rebecca Sellers MRN: [id number] Date of Birth: 23-Mar-1930   Medicare Important Message Given:  Yes    Kerin Salen 10/02/2017, 10:28 [place] Message  Patient Details  Name: Rebecca Sellers MRN: [id number] Date of Birth: [birth year]   Medicare Important Message Given:  Yes    Kerin Salen 10/02/2017, 10:28 AM

## 2017-10-02 NOTE — Progress Notes (Signed)
Patient ID: Rebecca Sellers, female   DOB: [birth year], 84 y.o.   MRN: [id number] [facility] Progress Note:   3 Days Post-Op  Subjective: Mental status is clear.   Objective: Vital signs in last 24 hours: Temp:  [98.1 F (36.7 C)-98.4 F (36.9 C)] 98.1 F (36.7 C) (08/29 0935) Pulse Rate:  [89-112] 97 (08/29 1055) Resp:  [18] 18 (08/29 1055) BP: (136-190)/(71-81) 158/72 (08/29 1055) SpO2:  [96 %-99 %] 96 % (08/29 1139)  Intake/Output from previous day: 08/28 0701 - 08/29 0700 In: 2478.2 [P.O.:1050; I.V.:1428.2] Out: 1201 [Urine:1200; Stool:1] Intake/Output this shift: Total I/O In: 241.3 [I.V.:241.3] Out: -   Physical Exam: Work of breathing is normal.  Incision ok  Lab Results:  Results for orders placed or performed during the hospital encounter of 09/29/17 (from the past 48 hour(s))  Glucose, capillary     Status: Abnormal   Collection Time: 09/30/17  3:55 PM  Result Value Ref Range   Glucose-Capillary 146 (H) 70 - 99 mg/dL  Glucose, capillary     Status: Abnormal   Collection Time: 09/30/17  7:53 PM  Result Value Ref Range   Glucose-Capillary 108 (H) 70 - 99 mg/dL  Glucose, capillary     Status: Abnormal   Collection Time: 09/30/17 11:47 PM  Result Value Ref Range   Glucose-Capillary 116 (H) 70 - 99 mg/dL  Glucose, capillary     Status: Abnormal   Collection Time: 10/01/17  4:02 AM  Result Value Ref Range   Glucose-Capillary 121 (H) 70 - 99 mg/dL  Glucose, capillary     Status: Abnormal   Collection Time: 10/01/17  7:48 AM  Result Value Ref Range   Glucose-Capillary 115 (H) 70 - 99 mg/dL  Glucose, capillary     Status: Abnormal   Collection Time: 10/01/17 11:34 AM  Result Value Ref Range   Glucose-Capillary 127 (H) 70 - 99 mg/dL  Glucose, capillary     Status: Abnormal   Collection Time: 10/01/17  3:25 PM  Result Value Ref Range   Glucose-Capillary 187 (H) 70 - 99 mg/dL  Glucose, capillary     Status: Abnormal   Collection Time: 10/01/17  8:51  PM  Result Value Ref Range   Glucose-Capillary 151 (H) 70 - 99 mg/dL  Glucose, capillary     Status: Abnormal   Collection Time: 10/01/17 11:49 PM  Result Value Ref Range   Glucose-Capillary 137 (H) 70 - 99 mg/dL  Glucose, capillary     Status: Abnormal   Collection Time: 10/02/17  3:38 AM  Result Value Ref Range   Glucose-Capillary 116 (H) 70 - 99 mg/dL  Glucose, capillary     Status: None   Collection Time: 10/02/17  7:32 AM  Result Value Ref Range   Glucose-Capillary 91 70 - 99 mg/dL    Radiology/Results: No results found.  Anti-infectives: Anti-infectives (From admission, onward)   Start     Dose/Rate Route Frequency Ordered Stop   09/29/17 2100  cefoTEtan (CEFOTAN) 2 g in sodium chloride 0.9 % 100 mL IVPB     2 g 200 mL/hr over 30 Minutes Intravenous Every 12 hours 09/29/17 1409 09/29/17 2218   09/29/17 0745  cefoTEtan (CEFOTAN) 2 g in sodium chloride 0.9 % 100 mL IVPB     2 g 200 mL/hr over 30 Minutes Intravenous On call to O.R. 09/29/17 0730 09/29/17 4098      Assessment/Plan: Problem List: Patient Active Problem List   Diagnosis Date Noted  . S/P  right colectomy August 2019 09/29/2017  . Carpal tunnel syndrome 02/20/2015    Discussed path with her.  Nodes negative -Will advance to carb modified diet 3 Days Post-Op    LOS: 3 days   Matt B. Hassell Done, MD, [facility] [phone] beeper [phone]  10/02/2017 12:18 PM

## 2017-10-03 LAB — GLUCOSE, CAPILLARY
Glucose-Capillary: 113 mg/dL — ABNORMAL HIGH (ref 70–99)
Glucose-Capillary: 109 mg/dL — ABNORMAL HIGH (ref 70–99)

## 2017-10-03 MED ORDER — HYDROCODONE-ACETAMINOPHEN 5-325 MG PO TABS: 1.0000 | ORAL_TABLET | ORAL | 0 refills | Status: DC | PRN

## 2017-10-03 NOTE — Discharge Instructions (Signed)
Laparoscopic Colectomy, Care After This sheet gives you information about how to care for yourself after your procedure. Your health care provider may also give you more specific instructions. If you have problems or questions, contact your health care provider. What can I expect after the procedure? After your procedure, it is common to have the following:  Pain in your abdomen, especially in the incision areas. You will be given medicine to control the pain.  Tiredness. This is a normal part of the recovery process. Your energy level will return to normal over the next several weeks.  Changes in your bowel movements, such as constipation or needing to go more often. Talk with your health care provider about how to manage this.  Follow these instructions at home: Medicines  Take over-the-counter and prescription medicines only as told by your health care provider.  Do not drive or use heavy machinery while taking prescription pain medicine.  Do not drink alcohol while taking prescription pain medicine.  If you were prescribed an antibiotic medicine, use it as told by your health care provider. Do not stop using the antibiotic even if you start to feel better. Incision care  Follow instructions from your health care provider about how to take care of your incision areas. Make sure you: ? Keep your incisions clean and dry. ? Wash your hands with soap and water before and after applying medicine to the areas, and before and after changing your bandage (dressing). If soap and water are not available, use hand sanitizer. ? Change your dressing as told by your health care provider. ? Leave stitches (sutures), skin glue, or adhesive strips in place. These skin closures may need to stay in place for 2 weeks or longer. If adhesive strip edges start to loosen and curl up, you may trim the loose edges. Do not remove adhesive strips completely unless your health care provider tells you to do  that.  Do not wear tight clothing over the incisions. Tight clothing may rub and irritate the incision areas, which may cause the incisions to open.  Do not take baths, swim, or use a hot tub until your health care provider approves. Ask your health care provider if you can take showers. You may only be allowed to take sponge baths for bathing.  Check your incision area every day for signs of infection. Check for: ? More redness, swelling, or pain. ? More fluid or blood. ? Warmth. ? Pus or a bad smell. Activity  Avoid lifting anything that is heavier than 10 lb (4.5 kg) for 2 weeks or until your health care provider says it is okay.  You may resume normal activities as told by your health care provider. Ask your health care provider what activities are safe for you.  Take rest breaks during the day as needed. Eating and drinking  Follow instructions from your health care provider about what you can eat after surgery.  To prevent or treat constipation while you are taking prescription pain medicine, your health care provider may recommend that you: ? Drink enough fluid to keep your urine clear or pale yellow. ? Take over-the-counter or prescription medicines. ? Eat foods that are high in fiber, such as fresh fruits and vegetables, whole grains, and beans. ? Limit foods that are high in fat and processed sugars, such as fried and sweet foods. General instructions  Ask your health care provider when you will need an appointment to get your sutures or staples removed.  Keep  all follow-up visits as told by your health care provider. This is important. Contact a health care provider if:  You have more redness, swelling, or pain around your incisions.  You have more fluid or blood coming from the incisions.  Your incisions feel warm to the touch.  You have pus or a bad smell coming from your incisions or your dressing.  You have a fever.  You have an incision that breaks open  (edges not staying together) after sutures or staples have been removed. Get help right away if:  You develop a rash.  You have chest pain or difficulty breathing.  You have pain or swelling in your legs.  You feel light-headed or you faint.  Your abdomen swells (becomes distended).  You have nausea or vomiting.  You have blood in your stool (feces). This information is not intended to replace advice given to you by your health care provider. Make sure you discuss any questions you have with your health care provider. Document Released: 08/10/2004 Document Revised: 10/23/2015 Document Reviewed: 10/23/2015 Elsevier Interactive Patient Education  [employer].

## 2017-10-03 NOTE — Discharge Summary (Signed)
Physician Discharge Summary  Patient ID: Rebecca Sellers MRN: [id number] DOB/AGE: [birth year] 82 y.o.  PCP: Shon Baton, MD  Admit date: 09/29/2017 Discharge date: 10/03/2017  Admission Diagnoses:  Ascending colon cancer  Discharge Diagnoses:  Same with 19 negative nodes and locally invasive  Principal Problem:   S/P right colectomy August 2019   Surgery:  Lap assisted right hemicolectomy  Discharged Condition: improved  Hospital Course:   Had surgery.  Began passing flatus and begun on liquids and advanced.  Ready for discharge on Friday, Aug 30  Consults: none  Significant Diagnostic Studies: path    Discharge Exam: Blood pressure (!) 159/79, pulse 93, temperature 98 F (36.7 C), temperature source Oral, resp. rate 16, height 5\' 3"  (1.6 m), weight 62.4 kg, SpO2 98 %. Incision without evidence of infection  Disposition: Discharge disposition: 01-Home or Self Care       Discharge Instructions    Call MD for:  redness, tenderness, or signs of infection (pain, swelling, redness, odor or green/yellow discharge around incision site)   Complete by:  As directed    Call MD for:  severe uncontrolled pain   Complete by:  As directed    Call MD for:  temperature >100.4   Complete by:  As directed    Diet - low sodium heart healthy   Complete by:  As directed    Discharge instructions   Complete by:  As directed    May shower at home Staple removal before going to the beach (~10 days postop)   Increase activity slowly   Complete by:  As directed      Allergies as of 10/03/2017      Reactions   Alprazolam Diarrhea, Other (See Comments)   Garlic Diarrhea, Nausea And Vomiting   Mobic [meloxicam] Diarrhea   Relafen [nabumetone] Diarrhea   Sulfa Antibiotics Other (See Comments)   fever   Trazodone And Nefazodone Diarrhea      Medication List    STOP taking these medications   metroNIDAZOLE 500 MG tablet Commonly known as:  FLAGYL   neomycin 500 MG  tablet Commonly known as:  MYCIFRADIN     TAKE these medications   amLODipine 2.5 MG tablet Commonly known as:  NORVASC Take 2.5 mg by mouth daily.   cholecalciferol 1000 units tablet Commonly known as:  VITAMIN D Take 1,000 Units by mouth daily.   denosumab 60 MG/ML Soln injection Commonly known as:  PROLIA Inject 60 mg into the skin every 6 (six) months. Administer in upper arm, thigh, or abdomen   famotidine 20 MG tablet Commonly known as:  PEPCID Take 20 mg by mouth daily.   fenofibrate 160 MG tablet Take 160 mg by mouth daily.   fluticasone 50 MCG/ACT nasal spray Commonly known as:  FLONASE Place 2 sprays into both nostrils daily as needed for allergies.   GAVILYTE-N WITH FLAVOR PACK 420 g solution Generic drug:  polyethylene glycol-electrolytes Take 4,000 mLs by mouth once.   HYDROcodone-acetaminophen 5-325 MG tablet Commonly known as:  NORCO/VICODIN Take 1-2 tablets by mouth every 4 (four) hours as needed for moderate pain.   levothyroxine 75 MCG tablet Commonly known as:  SYNTHROID, LEVOTHROID Take 75 mcg by mouth daily before breakfast.   lisinopril 40 MG tablet Commonly known as:  PRINIVIL,ZESTRIL Take 40 mg by mouth daily.   LIVALO 2 MG Tabs Generic drug:  Pitavastatin Calcium Take 2 mg by mouth daily.   multivitamin-prenatal 27-0.8 MG Tabs tablet Take 1 tablet by  mouth daily at 12 noon. ON HOLD FOR SURGERY   SYSTANE OP Place 1 drop into both eyes 3 (three) times daily as needed (dry eyes).   acetaminophen 500 MG tablet Commonly known as:  TYLENOL Take 1,000 mg by mouth 3 (three) times daily.   TYLENOL PO Take by mouth as needed.   vitamin B-12 500 MCG tablet Commonly known as:  CYANOCOBALAMIN Take 500 mcg by mouth daily.      Follow-up Information    Johnathan Hausen, MD. Schedule an appointment as soon as possible for a visit in 4 week(s).   Specialty:  General Surgery Contact information: [place]  [phone]           Signed: Pedro Earls 10/03/2017, 12:08 PM

## 2017-10-03 NOTE — Progress Notes (Signed)
Discharge instructions given to pt and all questions were answered. Pt taken out via wheelchair and was picked up by her sister.

## 2017-10-21 ENCOUNTER — Ambulatory Visit
Admission: RE | Admit: 2017-10-21 | Discharge: 2017-10-21 | Disposition: A | Discharge status: Home / Self Care | Payer: Medicare Other | Source: Ambulatory Visit | Attending: Internal Medicine | Admitting: Internal Medicine

## 2017-10-21 DIAGNOSIS — Z1231 Encounter for screening mammogram for malignant neoplasm of breast: Secondary | ICD-10-CM | POA: Diagnosis not present

## 2017-10-23 DIAGNOSIS — R82998 Other abnormal findings in urine: Secondary | ICD-10-CM | POA: Diagnosis not present

## 2017-10-23 DIAGNOSIS — M109 Gout, unspecified: Secondary | ICD-10-CM | POA: Diagnosis not present

## 2017-10-23 DIAGNOSIS — M810 Age-related osteoporosis without current pathological fracture: Secondary | ICD-10-CM | POA: Diagnosis not present

## 2017-10-23 DIAGNOSIS — E038 Other specified hypothyroidism: Secondary | ICD-10-CM | POA: Diagnosis not present

## 2017-10-23 DIAGNOSIS — I10 Essential (primary) hypertension: Secondary | ICD-10-CM | POA: Diagnosis not present

## 2017-10-23 DIAGNOSIS — E7849 Other hyperlipidemia: Secondary | ICD-10-CM | POA: Diagnosis not present

## 2017-10-23 DIAGNOSIS — E119 Type 2 diabetes mellitus without complications: Secondary | ICD-10-CM | POA: Diagnosis not present

## 2017-10-23 DIAGNOSIS — D649 Anemia, unspecified: Secondary | ICD-10-CM | POA: Diagnosis not present

## 2017-10-24 ENCOUNTER — Other Ambulatory Visit (HOSPITAL_COMMUNITY): Payer: Self-pay | Admitting: *Deleted

## 2017-10-27 ENCOUNTER — Ambulatory Visit (HOSPITAL_COMMUNITY)
Admission: RE | Admit: 2017-10-27 | Discharge: 2017-10-27 | Disposition: A | Discharge status: Home / Self Care | Payer: Medicare Other | Source: Ambulatory Visit | Attending: Internal Medicine | Admitting: Internal Medicine

## 2017-10-27 DIAGNOSIS — D649 Anemia, unspecified: Secondary | ICD-10-CM | POA: Diagnosis not present

## 2017-10-27 LAB — ABO/RH: ABO/RH(D): O NEG

## 2017-10-27 LAB — PREPARE RBC (CROSSMATCH)

## 2017-10-27 MED ORDER — FUROSEMIDE 20 MG PO TABS
20.0000 mg | ORAL_TABLET | Freq: Once | ORAL | Status: AC
  Administered 2017-10-27: 20 mg via ORAL
  Filled 2017-10-27 (×2): qty 1

## 2017-10-27 MED ORDER — SODIUM CHLORIDE 0.9% IV SOLUTION: Freq: Once | INTRAVENOUS | Status: DC

## 2017-10-27 MED ORDER — ACETAMINOPHEN 325 MG PO TABS
ORAL_TABLET | ORAL | Status: AC
  Filled 2017-10-27: qty 2

## 2017-10-27 MED ORDER — ACETAMINOPHEN 325 MG PO TABS
650.0000 mg | ORAL_TABLET | Freq: Once | ORAL | Status: AC
  Administered 2017-10-27: 650 mg via ORAL

## 2017-10-28 LAB — BPAM RBC
BLOOD PRODUCT EXPIRATION DATE: 201910202359
BLOOD PRODUCT EXPIRATION DATE: 201910202359
ISSUE DATE / TIME: 201909230956
ISSUE DATE / TIME: 201909231218
UNIT TYPE AND RH: 9500
UNIT TYPE AND RH: 9500

## 2017-10-28 LAB — TYPE AND SCREEN
ABO/RH(D): O NEG
ANTIBODY SCREEN: NEGATIVE
UNIT DIVISION: 0
Unit division: 0

## 2017-10-30 DIAGNOSIS — Z0000 Encounter for general adult medical examination without abnormal findings: Secondary | ICD-10-CM | POA: Diagnosis not present

## 2017-10-30 DIAGNOSIS — N184 Chronic kidney disease, stage 4 (severe): Secondary | ICD-10-CM | POA: Diagnosis not present

## 2017-10-30 DIAGNOSIS — I700 Atherosclerosis of aorta: Secondary | ICD-10-CM | POA: Diagnosis not present

## 2017-10-30 DIAGNOSIS — I8390 Asymptomatic varicose veins of unspecified lower extremity: Secondary | ICD-10-CM | POA: Diagnosis not present

## 2017-10-30 DIAGNOSIS — Z1389 Encounter for screening for other disorder: Secondary | ICD-10-CM | POA: Diagnosis not present

## 2017-10-30 DIAGNOSIS — D692 Other nonthrombocytopenic purpura: Secondary | ICD-10-CM | POA: Diagnosis not present

## 2017-10-30 DIAGNOSIS — I129 Hypertensive chronic kidney disease with stage 1 through stage 4 chronic kidney disease, or unspecified chronic kidney disease: Secondary | ICD-10-CM | POA: Diagnosis not present

## 2017-10-30 DIAGNOSIS — R413 Other amnesia: Secondary | ICD-10-CM | POA: Diagnosis not present

## 2017-10-30 DIAGNOSIS — Z23 Encounter for immunization: Secondary | ICD-10-CM | POA: Diagnosis not present

## 2017-10-30 DIAGNOSIS — M109 Gout, unspecified: Secondary | ICD-10-CM | POA: Diagnosis not present

## 2017-10-30 DIAGNOSIS — E119 Type 2 diabetes mellitus without complications: Secondary | ICD-10-CM | POA: Diagnosis not present

## 2017-10-30 DIAGNOSIS — C189 Malignant neoplasm of colon, unspecified: Secondary | ICD-10-CM | POA: Diagnosis not present

## 2017-11-04 DIAGNOSIS — Z1212 Encounter for screening for malignant neoplasm of rectum: Secondary | ICD-10-CM | POA: Diagnosis not present

## 2018-01-09 DIAGNOSIS — I129 Hypertensive chronic kidney disease with stage 1 through stage 4 chronic kidney disease, or unspecified chronic kidney disease: Secondary | ICD-10-CM | POA: Diagnosis not present

## 2018-01-09 DIAGNOSIS — D649 Anemia, unspecified: Secondary | ICD-10-CM | POA: Diagnosis not present

## 2018-01-09 DIAGNOSIS — G629 Polyneuropathy, unspecified: Secondary | ICD-10-CM | POA: Diagnosis not present

## 2018-01-09 DIAGNOSIS — N184 Chronic kidney disease, stage 4 (severe): Secondary | ICD-10-CM | POA: Diagnosis not present

## 2018-01-09 DIAGNOSIS — K029 Dental caries, unspecified: Secondary | ICD-10-CM | POA: Diagnosis not present

## 2018-01-09 DIAGNOSIS — E119 Type 2 diabetes mellitus without complications: Secondary | ICD-10-CM | POA: Diagnosis not present

## 2018-01-09 DIAGNOSIS — C189 Malignant neoplasm of colon, unspecified: Secondary | ICD-10-CM | POA: Diagnosis not present

## 2018-02-06 DIAGNOSIS — E119 Type 2 diabetes mellitus without complications: Secondary | ICD-10-CM | POA: Diagnosis not present

## 2018-02-09 IMAGING — US US CAROTID DUPLEX BILAT
1 series · 13 of 13 positions shown · non-contrast
Comparison: None.

CLINICAL DATA: 85-year-old female with dizziness

EXAM:
BILATERAL CAROTID DUPLEX ULTRASOUND
TECHNIQUE: Gray scale imaging, color Doppler and duplex ultrasound were
performed of bilateral carotid and vertebral arteries in the neck.

[Series 1: us carotid duplex bilat · 13 of 13 slices shown]
[im 1/13]
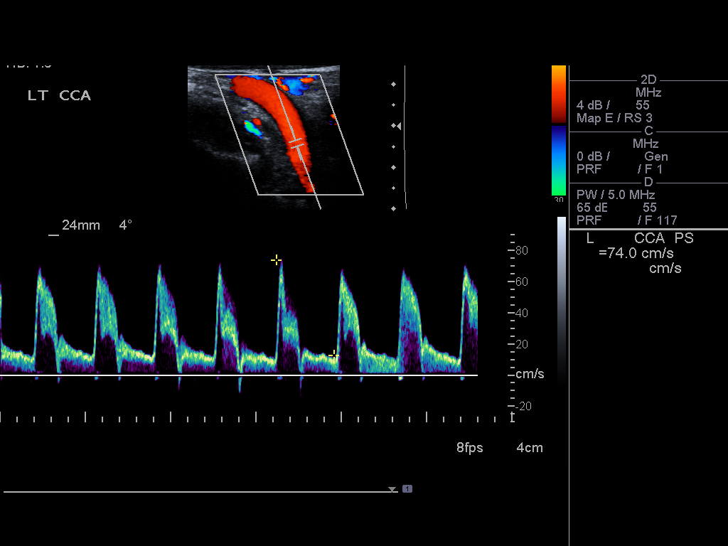
[im 2/13]
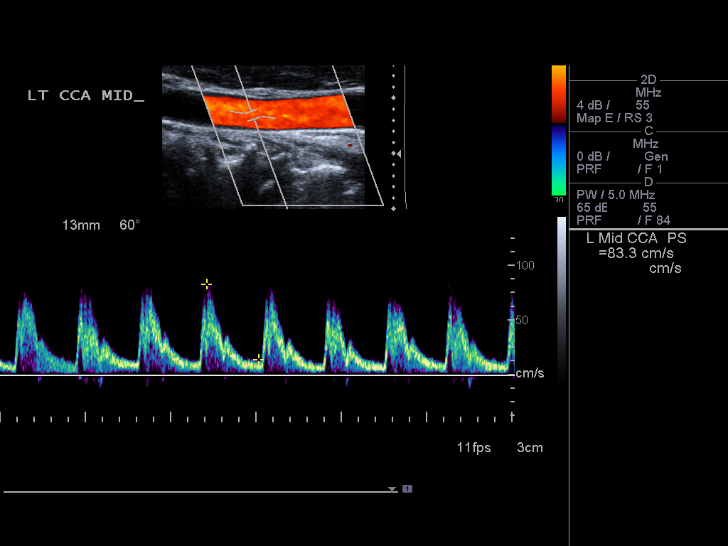
[im 3/13]
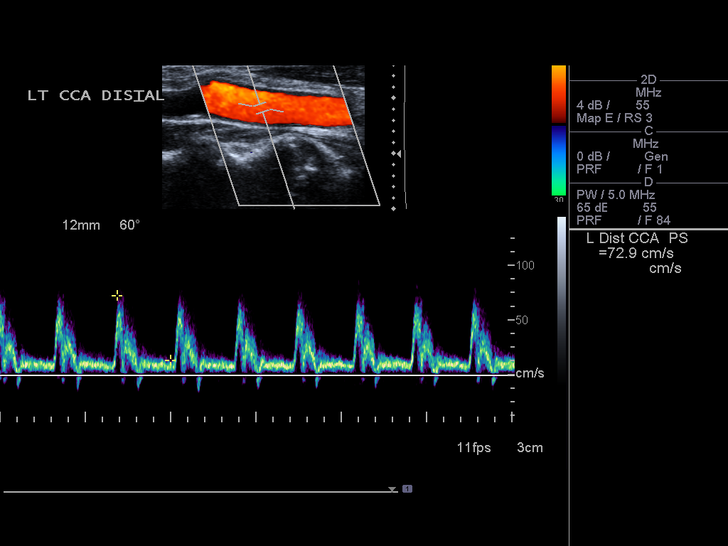
[im 4/13]
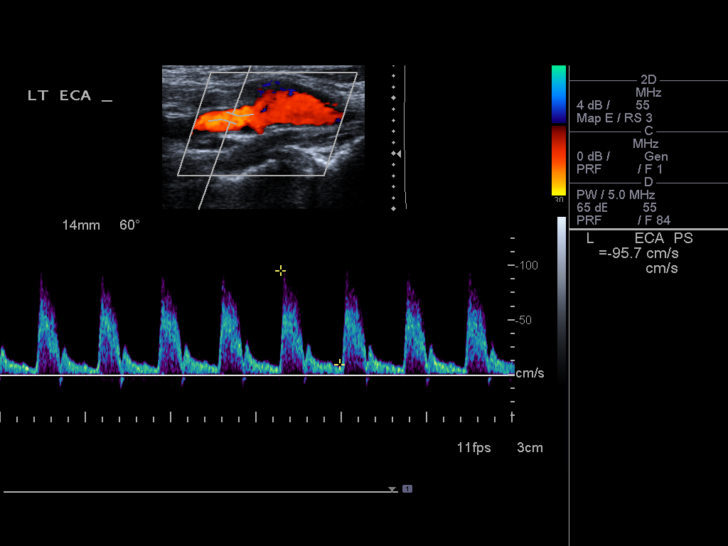
[im 5/13]
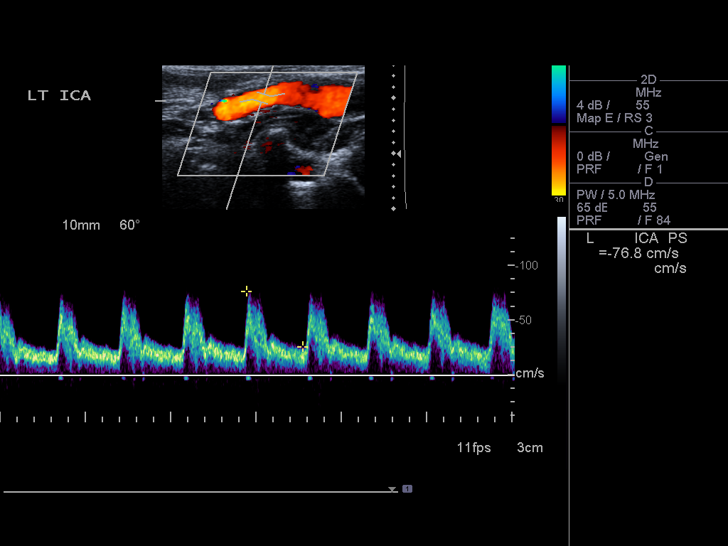
[im 6/13]
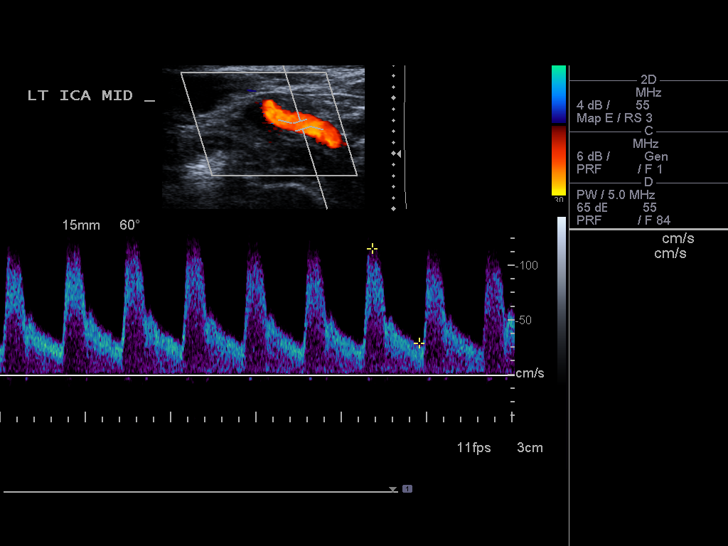
[im 7/13]
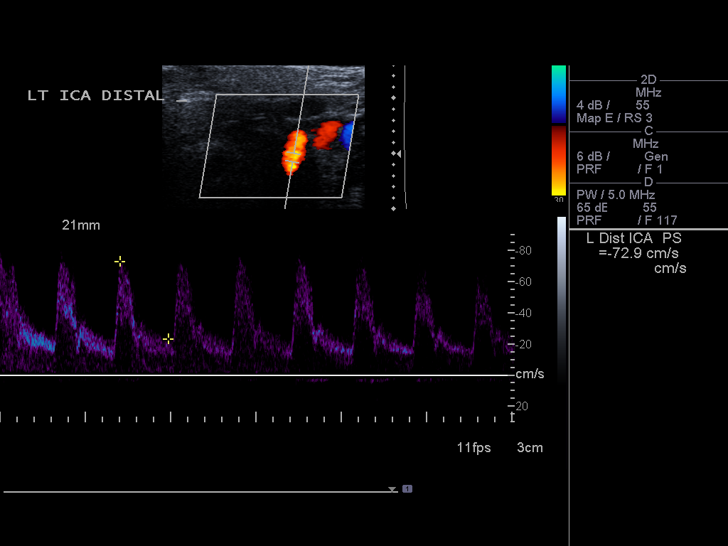
[im 8/13]
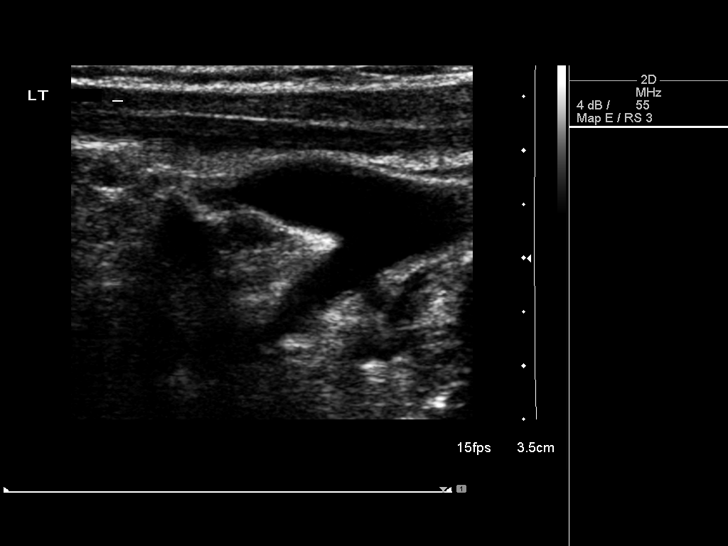
[im 9/13]
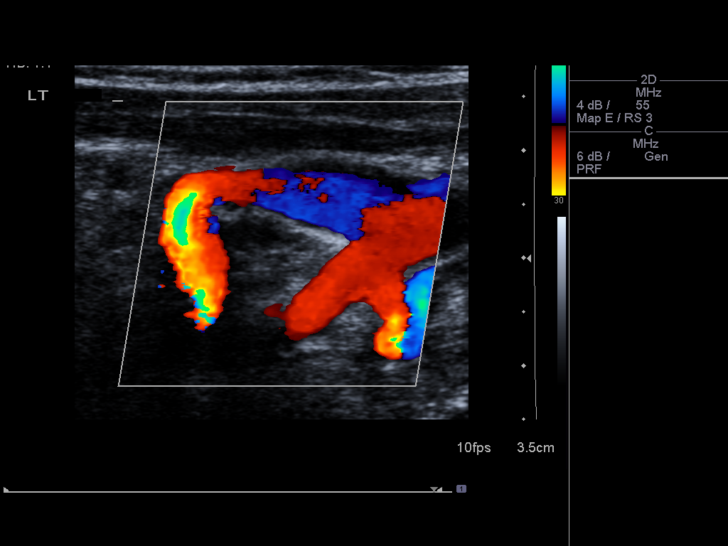
[im 10/13]
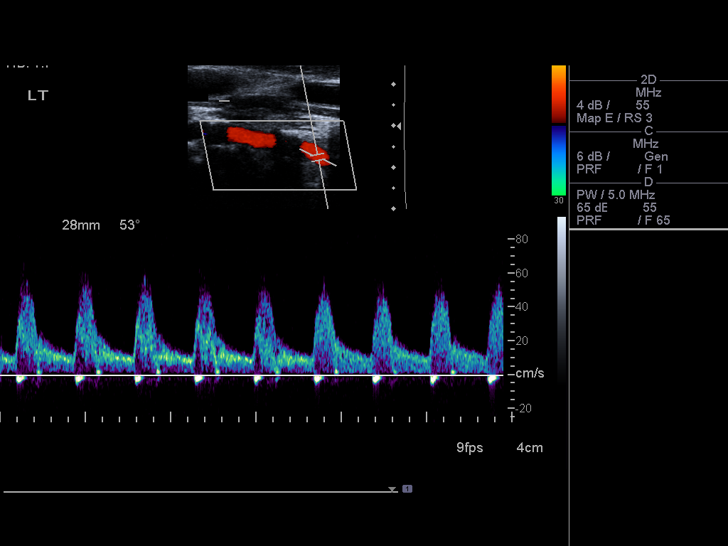
[im 11/13]
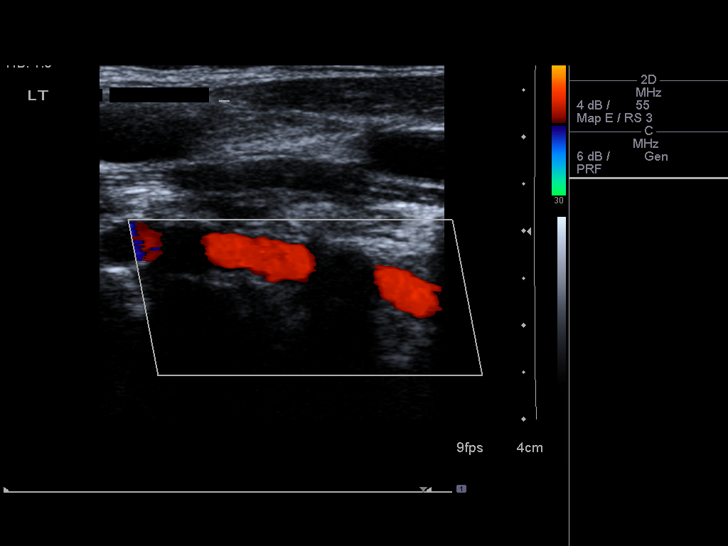
[im 12/13]
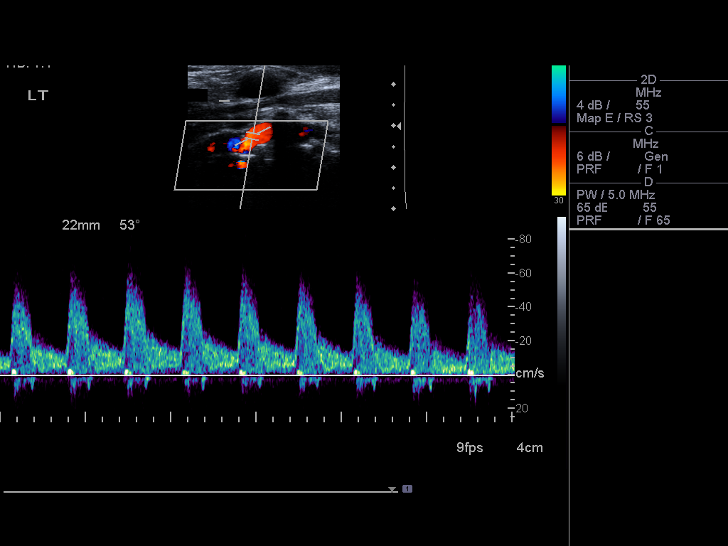
[im 13/13]
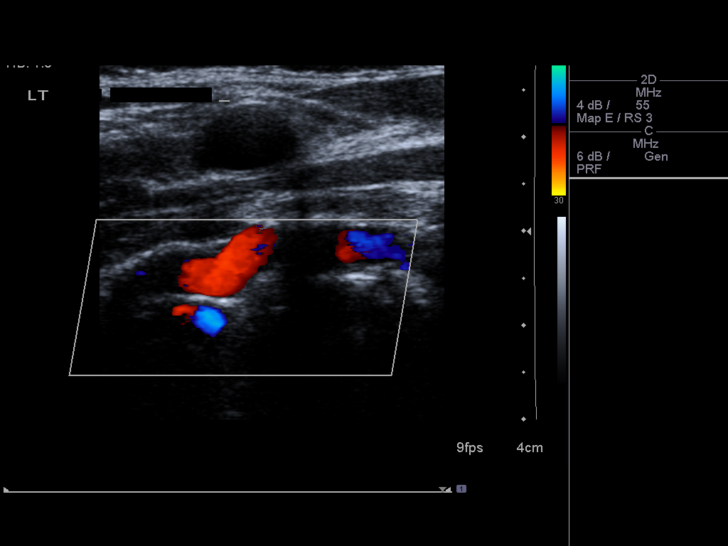

[13 of 13 positions shown; findings below may reference images not displayed]

FINDINGS: Criteria: Quantification of carotid stenosis is based on velocity
parameters that correlate the residual internal carotid diameter
with NASCET-based stenosis levels, using the diameter of the distal
internal carotid lumen as the denominator for stenosis measurement.

The following velocity measurements were obtained:

RIGHT

ICA:  95/37 cm/sec

CCA:  86/13 cm/sec

SYSTOLIC ICA/CCA RATIO:

DIASTOLIC ICA/CCA RATIO:

ECA:  75 cm/sec

LEFT

ICA:  122/31 cm/sec

CCA:  83/14 cm/sec

SYSTOLIC ICA/CCA RATIO:

DIASTOLIC ICA/CCA RATIO:

ECA:  96 cm/sec

RIGHT CAROTID ARTERY: No significant atherosclerotic plaque or
evidence of stenosis.

RIGHT VERTEBRAL ARTERY:  Patent with normal antegrade flow.

LEFT CAROTID ARTERY: No significant atherosclerotic plaque or
evidence of stenosis. The mid and distal internal carotid artery are
tortuous.

LEFT VERTEBRAL ARTERY:  Patent with normal antegrade flow.
IMPRESSION: Negative bilateral carotid duplex ultrasound.

## 2018-03-02 DIAGNOSIS — R198 Other specified symptoms and signs involving the digestive system and abdomen: Secondary | ICD-10-CM | POA: Diagnosis not present

## 2018-03-02 DIAGNOSIS — Z862 Personal history of diseases of the blood and blood-forming organs and certain disorders involving the immune mechanism: Secondary | ICD-10-CM | POA: Diagnosis not present

## 2018-03-02 DIAGNOSIS — Z85038 Personal history of other malignant neoplasm of large intestine: Secondary | ICD-10-CM | POA: Diagnosis not present

## 2018-05-05 DIAGNOSIS — R413 Other amnesia: Secondary | ICD-10-CM | POA: Diagnosis not present

## 2018-05-05 DIAGNOSIS — E785 Hyperlipidemia, unspecified: Secondary | ICD-10-CM | POA: Diagnosis not present

## 2018-05-05 DIAGNOSIS — I129 Hypertensive chronic kidney disease with stage 1 through stage 4 chronic kidney disease, or unspecified chronic kidney disease: Secondary | ICD-10-CM | POA: Diagnosis not present

## 2018-05-05 DIAGNOSIS — D692 Other nonthrombocytopenic purpura: Secondary | ICD-10-CM | POA: Diagnosis not present

## 2018-05-05 DIAGNOSIS — K029 Dental caries, unspecified: Secondary | ICD-10-CM | POA: Diagnosis not present

## 2018-05-05 DIAGNOSIS — E1122 Type 2 diabetes mellitus with diabetic chronic kidney disease: Secondary | ICD-10-CM | POA: Diagnosis not present

## 2018-05-05 DIAGNOSIS — E039 Hypothyroidism, unspecified: Secondary | ICD-10-CM | POA: Diagnosis not present

## 2018-05-05 DIAGNOSIS — N184 Chronic kidney disease, stage 4 (severe): Secondary | ICD-10-CM | POA: Diagnosis not present

## 2018-05-05 DIAGNOSIS — D649 Anemia, unspecified: Secondary | ICD-10-CM | POA: Diagnosis not present

## 2018-05-05 DIAGNOSIS — I700 Atherosclerosis of aorta: Secondary | ICD-10-CM | POA: Diagnosis not present

## 2018-05-05 DIAGNOSIS — M545 Low back pain: Secondary | ICD-10-CM | POA: Diagnosis not present

## 2018-05-05 DIAGNOSIS — C189 Malignant neoplasm of colon, unspecified: Secondary | ICD-10-CM | POA: Diagnosis not present

## 2018-06-05 DIAGNOSIS — H02883 Meibomian gland dysfunction of right eye, unspecified eyelid: Secondary | ICD-10-CM | POA: Diagnosis not present

## 2018-07-29 DIAGNOSIS — C189 Malignant neoplasm of colon, unspecified: Secondary | ICD-10-CM | POA: Diagnosis not present

## 2018-07-29 DIAGNOSIS — I700 Atherosclerosis of aorta: Secondary | ICD-10-CM | POA: Diagnosis not present

## 2018-07-29 DIAGNOSIS — E785 Hyperlipidemia, unspecified: Secondary | ICD-10-CM | POA: Diagnosis not present

## 2018-07-29 DIAGNOSIS — N184 Chronic kidney disease, stage 4 (severe): Secondary | ICD-10-CM | POA: Diagnosis not present

## 2018-07-29 DIAGNOSIS — I129 Hypertensive chronic kidney disease with stage 1 through stage 4 chronic kidney disease, or unspecified chronic kidney disease: Secondary | ICD-10-CM | POA: Diagnosis not present

## 2018-07-29 DIAGNOSIS — E1122 Type 2 diabetes mellitus with diabetic chronic kidney disease: Secondary | ICD-10-CM | POA: Diagnosis not present

## 2018-07-29 DIAGNOSIS — R413 Other amnesia: Secondary | ICD-10-CM | POA: Diagnosis not present

## 2018-07-29 DIAGNOSIS — M810 Age-related osteoporosis without current pathological fracture: Secondary | ICD-10-CM | POA: Diagnosis not present

## 2018-07-29 DIAGNOSIS — E039 Hypothyroidism, unspecified: Secondary | ICD-10-CM | POA: Diagnosis not present

## 2018-07-29 DIAGNOSIS — D649 Anemia, unspecified: Secondary | ICD-10-CM | POA: Diagnosis not present

## 2018-07-30 DIAGNOSIS — I10 Essential (primary) hypertension: Secondary | ICD-10-CM | POA: Diagnosis not present

## 2018-07-30 DIAGNOSIS — E119 Type 2 diabetes mellitus without complications: Secondary | ICD-10-CM | POA: Diagnosis not present

## 2018-07-30 DIAGNOSIS — M810 Age-related osteoporosis without current pathological fracture: Secondary | ICD-10-CM | POA: Diagnosis not present

## 2018-07-30 DIAGNOSIS — C189 Malignant neoplasm of colon, unspecified: Secondary | ICD-10-CM | POA: Diagnosis not present

## 2018-07-30 DIAGNOSIS — E038 Other specified hypothyroidism: Secondary | ICD-10-CM | POA: Diagnosis not present

## 2018-09-09 DIAGNOSIS — Z85038 Personal history of other malignant neoplasm of large intestine: Secondary | ICD-10-CM | POA: Diagnosis not present

## 2018-09-09 DIAGNOSIS — R198 Other specified symptoms and signs involving the digestive system and abdomen: Secondary | ICD-10-CM | POA: Diagnosis not present

## 2018-09-11 ENCOUNTER — Other Ambulatory Visit: Payer: Self-pay | Admitting: Internal Medicine

## 2018-09-11 DIAGNOSIS — Z1231 Encounter for screening mammogram for malignant neoplasm of breast: Secondary | ICD-10-CM

## 2018-10-23 DIAGNOSIS — Z1159 Encounter for screening for other viral diseases: Secondary | ICD-10-CM | POA: Diagnosis not present

## 2018-10-26 DIAGNOSIS — R82998 Other abnormal findings in urine: Secondary | ICD-10-CM | POA: Diagnosis not present

## 2018-10-26 DIAGNOSIS — E7849 Other hyperlipidemia: Secondary | ICD-10-CM | POA: Diagnosis not present

## 2018-10-26 DIAGNOSIS — M109 Gout, unspecified: Secondary | ICD-10-CM | POA: Diagnosis not present

## 2018-10-26 DIAGNOSIS — I10 Essential (primary) hypertension: Secondary | ICD-10-CM | POA: Diagnosis not present

## 2018-10-26 DIAGNOSIS — E119 Type 2 diabetes mellitus without complications: Secondary | ICD-10-CM | POA: Diagnosis not present

## 2018-10-26 DIAGNOSIS — M810 Age-related osteoporosis without current pathological fracture: Secondary | ICD-10-CM | POA: Diagnosis not present

## 2018-10-28 DIAGNOSIS — Z85038 Personal history of other malignant neoplasm of large intestine: Secondary | ICD-10-CM | POA: Diagnosis not present

## 2018-10-28 DIAGNOSIS — K5730 Diverticulosis of large intestine without perforation or abscess without bleeding: Secondary | ICD-10-CM | POA: Diagnosis not present

## 2018-10-29 DIAGNOSIS — Z1212 Encounter for screening for malignant neoplasm of rectum: Secondary | ICD-10-CM | POA: Diagnosis not present

## 2018-10-30 DIAGNOSIS — Z85038 Personal history of other malignant neoplasm of large intestine: Secondary | ICD-10-CM | POA: Diagnosis not present

## 2018-11-02 DIAGNOSIS — E785 Hyperlipidemia, unspecified: Secondary | ICD-10-CM | POA: Diagnosis not present

## 2018-11-02 DIAGNOSIS — K029 Dental caries, unspecified: Secondary | ICD-10-CM | POA: Diagnosis not present

## 2018-11-02 DIAGNOSIS — Z0000 Encounter for general adult medical examination without abnormal findings: Secondary | ICD-10-CM | POA: Diagnosis not present

## 2018-11-02 DIAGNOSIS — N184 Chronic kidney disease, stage 4 (severe): Secondary | ICD-10-CM | POA: Diagnosis not present

## 2018-11-02 DIAGNOSIS — I129 Hypertensive chronic kidney disease with stage 1 through stage 4 chronic kidney disease, or unspecified chronic kidney disease: Secondary | ICD-10-CM | POA: Diagnosis not present

## 2018-11-02 DIAGNOSIS — D692 Other nonthrombocytopenic purpura: Secondary | ICD-10-CM | POA: Diagnosis not present

## 2018-11-02 DIAGNOSIS — C189 Malignant neoplasm of colon, unspecified: Secondary | ICD-10-CM | POA: Diagnosis not present

## 2018-11-02 DIAGNOSIS — E1122 Type 2 diabetes mellitus with diabetic chronic kidney disease: Secondary | ICD-10-CM | POA: Diagnosis not present

## 2018-11-02 DIAGNOSIS — M109 Gout, unspecified: Secondary | ICD-10-CM | POA: Diagnosis not present

## 2018-11-02 DIAGNOSIS — R413 Other amnesia: Secondary | ICD-10-CM | POA: Diagnosis not present

## 2018-11-02 DIAGNOSIS — I700 Atherosclerosis of aorta: Secondary | ICD-10-CM | POA: Diagnosis not present

## 2018-11-02 DIAGNOSIS — I8390 Asymptomatic varicose veins of unspecified lower extremity: Secondary | ICD-10-CM | POA: Diagnosis not present

## 2018-11-05 DIAGNOSIS — Z23 Encounter for immunization: Secondary | ICD-10-CM | POA: Diagnosis not present

## 2018-11-25 ENCOUNTER — Ambulatory Visit
Admission: RE | Admit: 2018-11-25 | Discharge: 2018-11-25 | Disposition: A | Discharge status: Home / Self Care | Payer: Medicare Other | Source: Ambulatory Visit | Attending: Internal Medicine | Admitting: Internal Medicine

## 2018-11-25 DIAGNOSIS — Z1231 Encounter for screening mammogram for malignant neoplasm of breast: Secondary | ICD-10-CM

## 2019-01-14 ENCOUNTER — Other Ambulatory Visit: Payer: Self-pay

## 2019-01-25 ENCOUNTER — Other Ambulatory Visit: Payer: Self-pay | Admitting: *Deleted

## 2019-01-25 NOTE — Patient Outreach (Signed)
[facility]) Care Management  01/25/2019  LADELLE TEODORO [birth year] [id number]   Telephone Screen  Referral Date:  01/14/2019 Referral Source:  EMMI Prevent Reason for Referral:  Screening Insurance:  Medicare   Outreach Attempt:  Successful telephone outreach to patient for telephone screening.  HIPAA verified with patient.  Patient completed telephone screening.  Social:  Patient lives at home alone.  Reports being independent with ADLs and IADLs.  Ambulates independently and denies any falls in the past year.  Drives herself to her medical appointments.  DME in the home include:  Quad cane, rolling walker, lower partial denture, blood pressure cuff, CBG meter, reading glasses, grab bar in the shower, and scale.  Conditions:  Per chart review and discussion with patient, PMH include but not limited to:  Colon cancer with right hemicolectomy 2019, bilateral cataracts removed, anxiety, arthritis, diabetes, neuropathy, GERD, hypertension, hyperlipidemia, and thyroid disease.  Denies any recent emergency room visits or hospitalizations.  Reports her diabetes is well managed with Hgb A1C of 6.3 controlled with diet.  Monitors blood sugars three times a week with blood sugar ranges of 100-140's.  Checks blood pressures daily and states her systolic blood pressure is normally in 140's.  Medications:  Patient reports taking about 10-15 medications daily.  States she manages her medications herself and denies any issues with affording medications.  Does state she feels her Gabapentin is causing some memory problems and making her become more forgetful.  Discussed and offered [place] referral for medication reconciliation and patient declines at this time.  States she has stopped taking the medication and plans to speak with her primary care provider tomorrow morning.  Again declines [place] referral.  Appointments: Patient reporting she has had about 3 Telehealth visit  with primary care provider, Dr. Virgina Jock in the past year.  She believe last appointment was in October.  Advanced Directives:  Reports having a Living Will and Healthcare Power of Attorney and does not wish to make changes at this time.   Consent:  [facility] services reviewed and discussed.  Discussed with patient about Disease Management outreaches.  Patient declining [facility] services at this time.  Agrees to [facility] brochure to be mailed.  Encouraged patient to contact [facility] in the future if needs arise.  Plan:  RN Health Coach will close case and make patient inactive with [facility] due to patient declining services a this time.  RN Health Coach will send patient Successful Outreach Letter with [place].   [place] Coach [phone] [email or web address]

## 2019-02-10 DIAGNOSIS — E119 Type 2 diabetes mellitus without complications: Secondary | ICD-10-CM | POA: Diagnosis not present

## 2019-02-16 DIAGNOSIS — Z803 Family history of malignant neoplasm of breast: Secondary | ICD-10-CM | POA: Diagnosis not present

## 2019-02-16 DIAGNOSIS — C189 Malignant neoplasm of colon, unspecified: Secondary | ICD-10-CM | POA: Diagnosis not present

## 2019-02-23 DIAGNOSIS — C189 Malignant neoplasm of colon, unspecified: Secondary | ICD-10-CM | POA: Diagnosis not present

## 2019-03-04 DIAGNOSIS — C189 Malignant neoplasm of colon, unspecified: Secondary | ICD-10-CM | POA: Diagnosis not present

## 2019-03-04 DIAGNOSIS — R350 Frequency of micturition: Secondary | ICD-10-CM | POA: Diagnosis not present

## 2019-03-04 DIAGNOSIS — I700 Atherosclerosis of aorta: Secondary | ICD-10-CM | POA: Diagnosis not present

## 2019-03-04 DIAGNOSIS — H33302 Unspecified retinal break, left eye: Secondary | ICD-10-CM | POA: Diagnosis not present

## 2019-03-04 DIAGNOSIS — R4701 Aphasia: Secondary | ICD-10-CM | POA: Diagnosis not present

## 2019-03-04 DIAGNOSIS — D649 Anemia, unspecified: Secondary | ICD-10-CM | POA: Diagnosis not present

## 2019-03-04 DIAGNOSIS — E1122 Type 2 diabetes mellitus with diabetic chronic kidney disease: Secondary | ICD-10-CM | POA: Diagnosis not present

## 2019-03-04 DIAGNOSIS — R413 Other amnesia: Secondary | ICD-10-CM | POA: Diagnosis not present

## 2019-03-04 DIAGNOSIS — N184 Chronic kidney disease, stage 4 (severe): Secondary | ICD-10-CM | POA: Diagnosis not present

## 2019-03-04 DIAGNOSIS — G629 Polyneuropathy, unspecified: Secondary | ICD-10-CM | POA: Diagnosis not present

## 2019-03-04 DIAGNOSIS — I129 Hypertensive chronic kidney disease with stage 1 through stage 4 chronic kidney disease, or unspecified chronic kidney disease: Secondary | ICD-10-CM | POA: Diagnosis not present

## 2019-03-04 DIAGNOSIS — R0789 Other chest pain: Secondary | ICD-10-CM | POA: Diagnosis not present

## 2019-03-05 ENCOUNTER — Other Ambulatory Visit (HOSPITAL_COMMUNITY): Payer: Self-pay | Admitting: Internal Medicine

## 2019-03-05 DIAGNOSIS — R4701 Aphasia: Secondary | ICD-10-CM

## 2019-03-05 DIAGNOSIS — G459 Transient cerebral ischemic attack, unspecified: Secondary | ICD-10-CM

## 2019-03-10 ENCOUNTER — Telehealth (HOSPITAL_COMMUNITY): Payer: Self-pay

## 2019-03-10 NOTE — Telephone Encounter (Signed)
The above patient or their representative was contacted and gave the following answers to these questions:         Do you have any of the following symptoms?    NO  Fever                    Cough                   Shortness of breath  Do  you have any of the following other symptoms?    muscle pain         vomiting,        diarrhea        rash         weakness        red eye        abdominal pain         bruising          bruising or bleeding              joint pain           severe headache    Have you been in contact with someone who was or has been sick in the past 2 weeks?  NO  Yes                 Unsure                         Unable to assess   Does the person that you were in contact with have any of the following symptoms?   Cough         shortness of breath           muscle pain         vomiting,            diarrhea            rash            weakness           fever            red eye           abdominal pain           bruising  or  bleeding                joint pain                severe headache                 COMMENTS OR ACTION PLAN FOR THIS PATIENT:        ALL QUESTIONS WERE ANSWERED/[facility] PATIENT HAS HAD 1ST COVID SHOT

## 2019-03-12 ENCOUNTER — Ambulatory Visit (HOSPITAL_COMMUNITY)
Admission: RE | Admit: 2019-03-12 | Discharge: 2019-03-12 | Disposition: A | Discharge status: Home / Self Care | Payer: Medicare Other | Source: Ambulatory Visit | Attending: Surgery | Admitting: Surgery

## 2019-03-12 ENCOUNTER — Other Ambulatory Visit (HOSPITAL_COMMUNITY): Payer: Self-pay | Admitting: Internal Medicine

## 2019-03-12 ENCOUNTER — Other Ambulatory Visit: Payer: Self-pay

## 2019-03-12 DIAGNOSIS — G459 Transient cerebral ischemic attack, unspecified: Secondary | ICD-10-CM

## 2019-03-15 DIAGNOSIS — H348122 Central retinal vein occlusion, left eye, stable: Secondary | ICD-10-CM | POA: Diagnosis not present

## 2019-03-22 ENCOUNTER — Other Ambulatory Visit: Payer: Self-pay

## 2019-03-22 ENCOUNTER — Ambulatory Visit (HOSPITAL_COMMUNITY): Payer: Medicare Other | Attending: Cardiology

## 2019-03-22 DIAGNOSIS — R4701 Aphasia: Secondary | ICD-10-CM | POA: Insufficient documentation

## 2019-03-22 DIAGNOSIS — G459 Transient cerebral ischemic attack, unspecified: Secondary | ICD-10-CM

## 2019-04-27 DIAGNOSIS — H34812 Central retinal vein occlusion, left eye, with macular edema: Secondary | ICD-10-CM | POA: Diagnosis not present

## 2019-05-27 IMAGING — MG MM DIGITAL SCREENING BILAT W/ TOMO W/ CAD
8 of 13 series · 8 of 29 positions shown · non-contrast
Comparison: Previous exam(s).

CLINICAL DATA: Screening.

EXAM:
2D DIGITAL SCREENING BILATERAL MAMMOGRAM WITH CAD AND ADJUNCT TOMO

[R MLO (1 of 2)]
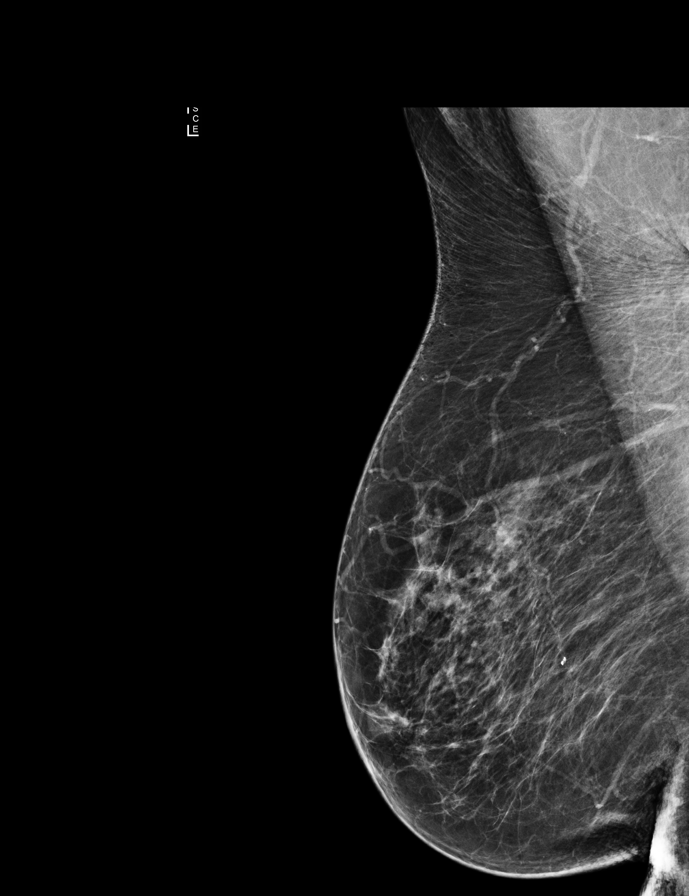

[R CC synth-2D]
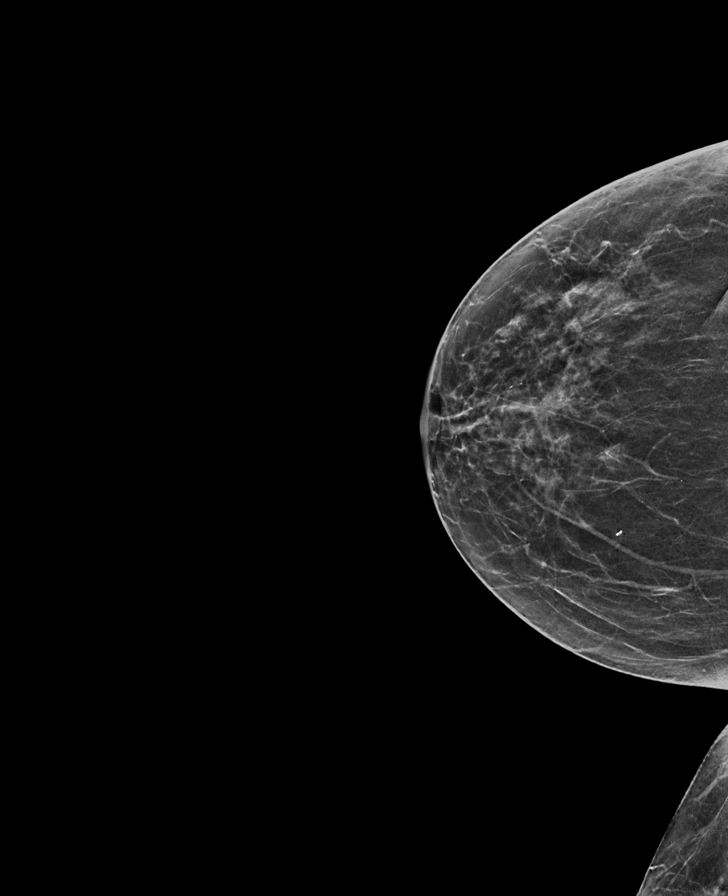

[R CC]
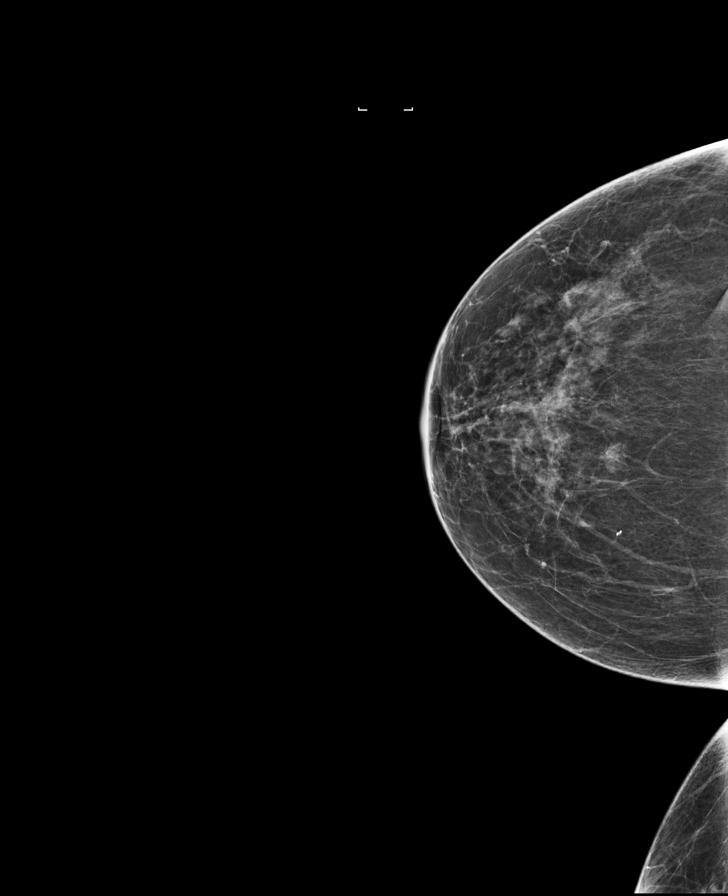

[R MLO (2 of 2)]
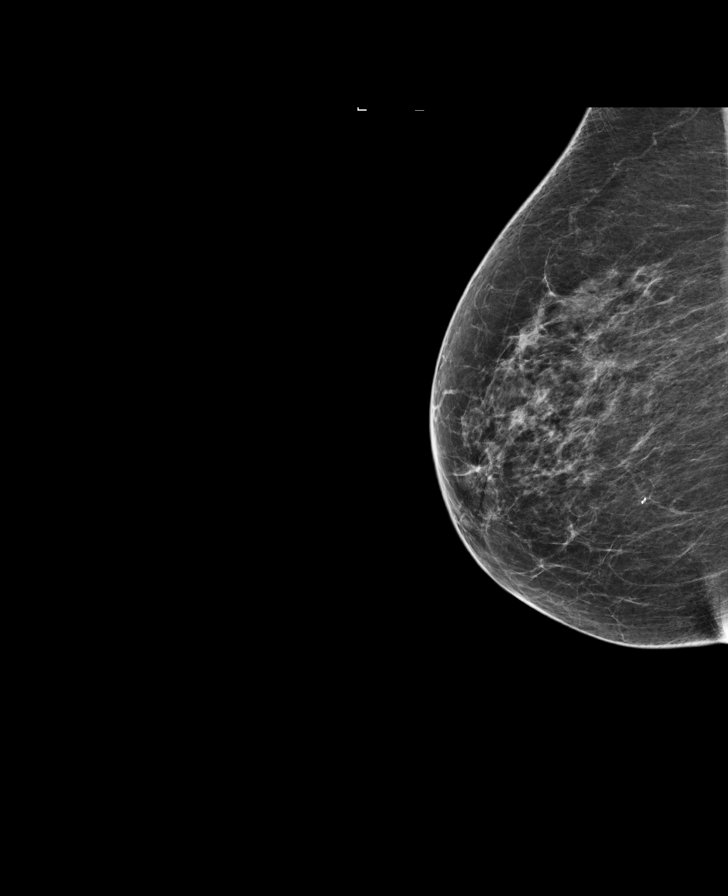

[L MLO synth-2D]
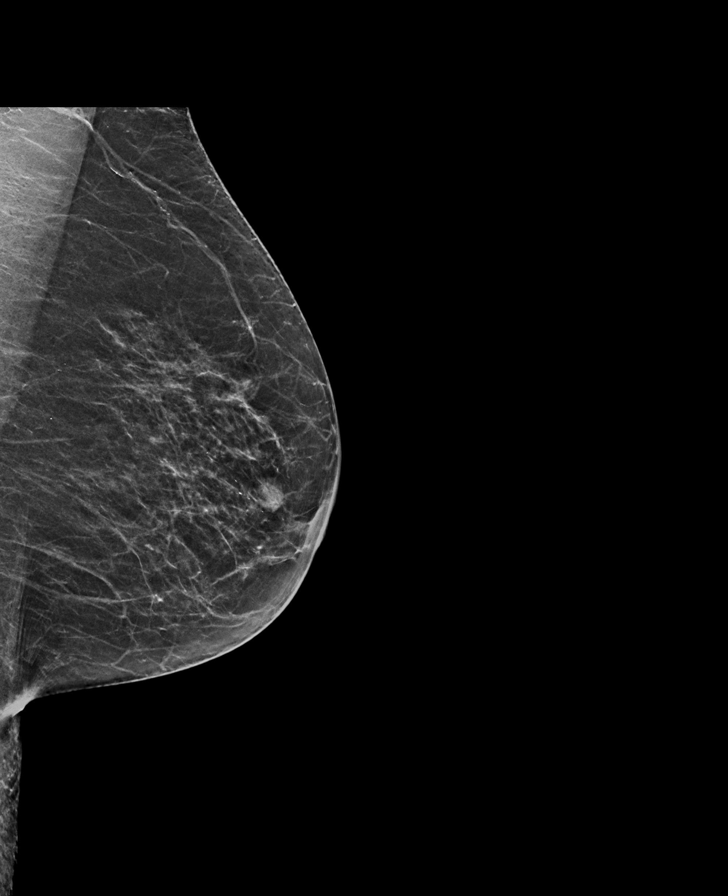

[L CC synth-2D]
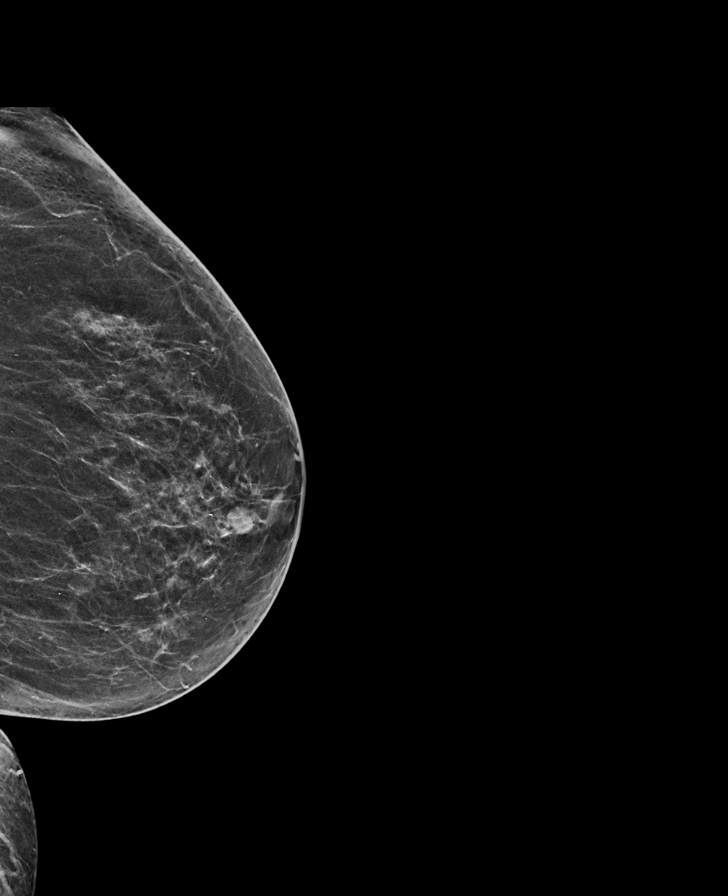

[L CC]
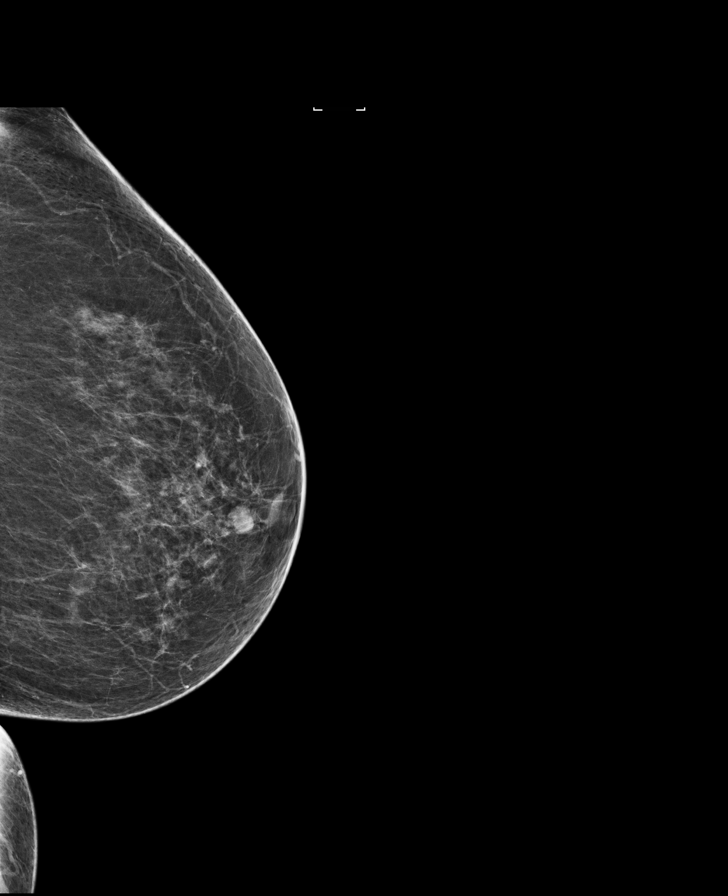

[R MLO synth-2D]
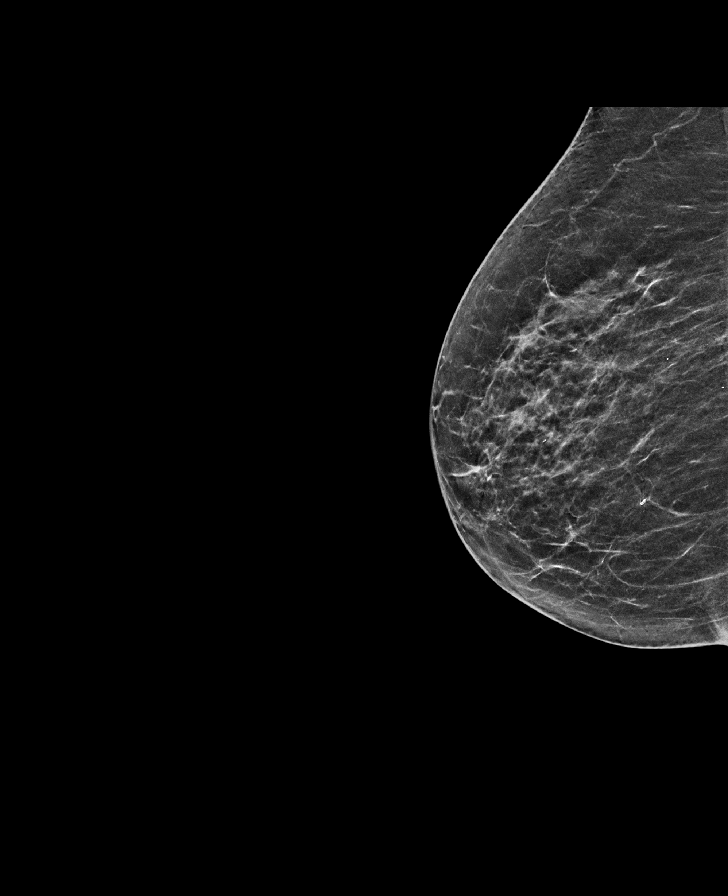

[8 of 29 positions shown; findings below may reference images not displayed]

ACR Breast Density Category b: There are scattered areas of
fibroglandular density.
FINDINGS: There are no findings suspicious for malignancy. Images were
processed with CAD.
IMPRESSION: No mammographic evidence of malignancy. A result letter of this
screening mammogram will be mailed directly to the patient.

RECOMMENDATION:
Screening mammogram in one year. (Code:[id number])

BI-RADS CATEGORY  2: Benign.

## 2019-06-01 DIAGNOSIS — H34812 Central retinal vein occlusion, left eye, with macular edema: Secondary | ICD-10-CM | POA: Diagnosis not present

## 2019-07-02 DIAGNOSIS — R413 Other amnesia: Secondary | ICD-10-CM | POA: Diagnosis not present

## 2019-07-02 DIAGNOSIS — D692 Other nonthrombocytopenic purpura: Secondary | ICD-10-CM | POA: Diagnosis not present

## 2019-07-02 DIAGNOSIS — E039 Hypothyroidism, unspecified: Secondary | ICD-10-CM | POA: Diagnosis not present

## 2019-07-02 DIAGNOSIS — I700 Atherosclerosis of aorta: Secondary | ICD-10-CM | POA: Diagnosis not present

## 2019-07-02 DIAGNOSIS — R279 Unspecified lack of coordination: Secondary | ICD-10-CM | POA: Diagnosis not present

## 2019-07-02 DIAGNOSIS — C189 Malignant neoplasm of colon, unspecified: Secondary | ICD-10-CM | POA: Diagnosis not present

## 2019-07-02 DIAGNOSIS — R4701 Aphasia: Secondary | ICD-10-CM | POA: Diagnosis not present

## 2019-07-02 DIAGNOSIS — I129 Hypertensive chronic kidney disease with stage 1 through stage 4 chronic kidney disease, or unspecified chronic kidney disease: Secondary | ICD-10-CM | POA: Diagnosis not present

## 2019-07-02 DIAGNOSIS — E1122 Type 2 diabetes mellitus with diabetic chronic kidney disease: Secondary | ICD-10-CM | POA: Diagnosis not present

## 2019-07-02 DIAGNOSIS — N184 Chronic kidney disease, stage 4 (severe): Secondary | ICD-10-CM | POA: Diagnosis not present

## 2019-07-02 DIAGNOSIS — H348122 Central retinal vein occlusion, left eye, stable: Secondary | ICD-10-CM | POA: Diagnosis not present

## 2019-07-13 DIAGNOSIS — H34812 Central retinal vein occlusion, left eye, with macular edema: Secondary | ICD-10-CM | POA: Diagnosis not present

## 2019-08-24 DIAGNOSIS — H34812 Central retinal vein occlusion, left eye, with macular edema: Secondary | ICD-10-CM | POA: Diagnosis not present

## 2019-09-07 DIAGNOSIS — S3992XA Unspecified injury of lower back, initial encounter: Secondary | ICD-10-CM | POA: Diagnosis not present

## 2019-09-07 DIAGNOSIS — M533 Sacrococcygeal disorders, not elsewhere classified: Secondary | ICD-10-CM | POA: Diagnosis not present

## 2019-09-07 DIAGNOSIS — W010XXA Fall on same level from slipping, tripping and stumbling without subsequent striking against object, initial encounter: Secondary | ICD-10-CM | POA: Diagnosis not present

## 2019-09-10 DIAGNOSIS — M545 Low back pain: Secondary | ICD-10-CM | POA: Diagnosis not present

## 2019-09-20 DIAGNOSIS — M545 Low back pain: Secondary | ICD-10-CM | POA: Diagnosis not present

## 2019-09-20 DIAGNOSIS — M4310 Spondylolisthesis, site unspecified: Secondary | ICD-10-CM | POA: Diagnosis not present

## 2019-09-30 ENCOUNTER — Ambulatory Visit: Payer: Medicare Other | Admitting: Dermatology

## 2019-10-06 DIAGNOSIS — M545 Low back pain: Secondary | ICD-10-CM | POA: Diagnosis not present

## 2019-10-08 DIAGNOSIS — M8438XD Stress fracture, other site, subsequent encounter for fracture with routine healing: Secondary | ICD-10-CM | POA: Diagnosis not present

## 2019-10-12 ENCOUNTER — Telehealth (HOSPITAL_COMMUNITY): Payer: Self-pay

## 2019-10-12 NOTE — Telephone Encounter (Signed)
Left message for medical records to make cd of lumbar spine so that the courier can pick up. AW

## 2019-10-13 ENCOUNTER — Other Ambulatory Visit (HOSPITAL_COMMUNITY): Payer: Self-pay | Admitting: Interventional Radiology

## 2019-10-13 ENCOUNTER — Ambulatory Visit
Admission: RE | Admit: 2019-10-13 | Discharge: 2019-10-13 | Disposition: A | Discharge status: Home / Self Care | Payer: Self-pay | Source: Ambulatory Visit | Attending: Interventional Radiology | Admitting: Interventional Radiology

## 2019-10-13 DIAGNOSIS — R52 Pain, unspecified: Secondary | ICD-10-CM

## 2019-10-20 ENCOUNTER — Other Ambulatory Visit (HOSPITAL_COMMUNITY): Payer: Self-pay | Admitting: Interventional Radiology

## 2019-10-20 DIAGNOSIS — M8448XA Pathological fracture, other site, initial encounter for fracture: Secondary | ICD-10-CM

## 2019-10-25 ENCOUNTER — Other Ambulatory Visit: Payer: Self-pay | Admitting: Student

## 2019-10-25 ENCOUNTER — Other Ambulatory Visit: Payer: Self-pay | Admitting: Radiology

## 2019-10-26 ENCOUNTER — Ambulatory Visit (HOSPITAL_COMMUNITY)
Admission: RE | Admit: 2019-10-26 | Discharge: 2019-10-26 | Disposition: A | Discharge status: Home / Self Care | Payer: Medicare Other | Source: Ambulatory Visit | Attending: Interventional Radiology | Admitting: Interventional Radiology

## 2019-10-26 ENCOUNTER — Other Ambulatory Visit: Payer: Self-pay

## 2019-10-26 ENCOUNTER — Encounter (HOSPITAL_COMMUNITY): Payer: Self-pay

## 2019-10-26 DIAGNOSIS — M810 Age-related osteoporosis without current pathological fracture: Secondary | ICD-10-CM | POA: Diagnosis not present

## 2019-10-26 DIAGNOSIS — K219 Gastro-esophageal reflux disease without esophagitis: Secondary | ICD-10-CM | POA: Insufficient documentation

## 2019-10-26 DIAGNOSIS — Z882 Allergy status to sulfonamides status: Secondary | ICD-10-CM | POA: Insufficient documentation

## 2019-10-26 DIAGNOSIS — Z888 Allergy status to other drugs, medicaments and biological substances status: Secondary | ICD-10-CM | POA: Insufficient documentation

## 2019-10-26 DIAGNOSIS — Z85038 Personal history of other malignant neoplasm of large intestine: Secondary | ICD-10-CM | POA: Diagnosis not present

## 2019-10-26 DIAGNOSIS — Z79899 Other long term (current) drug therapy: Secondary | ICD-10-CM | POA: Diagnosis not present

## 2019-10-26 DIAGNOSIS — W19XXXS Unspecified fall, sequela: Secondary | ICD-10-CM | POA: Diagnosis not present

## 2019-10-26 DIAGNOSIS — S3210XA Unspecified fracture of sacrum, initial encounter for closed fracture: Secondary | ICD-10-CM | POA: Diagnosis not present

## 2019-10-26 DIAGNOSIS — M8448XS Pathological fracture, other site, sequela: Secondary | ICD-10-CM | POA: Insufficient documentation

## 2019-10-26 DIAGNOSIS — Z79890 Hormone replacement therapy (postmenopausal): Secondary | ICD-10-CM | POA: Diagnosis not present

## 2019-10-26 DIAGNOSIS — I10 Essential (primary) hypertension: Secondary | ICD-10-CM | POA: Diagnosis not present

## 2019-10-26 DIAGNOSIS — M8448XA Pathological fracture, other site, initial encounter for fracture: Secondary | ICD-10-CM

## 2019-10-26 DIAGNOSIS — E039 Hypothyroidism, unspecified: Secondary | ICD-10-CM | POA: Insufficient documentation

## 2019-10-26 DIAGNOSIS — E119 Type 2 diabetes mellitus without complications: Secondary | ICD-10-CM | POA: Diagnosis not present

## 2019-10-26 DIAGNOSIS — M4848XA Fatigue fracture of vertebra, sacral and sacrococcygeal region, initial encounter for fracture: Secondary | ICD-10-CM | POA: Diagnosis not present

## 2019-10-26 HISTORY — PX: IR SACROPLASTY BILATERAL: IMG5561

## 2019-10-26 LAB — URINALYSIS, COMPLETE (UACMP) WITH MICROSCOPIC
Bilirubin Urine: NEGATIVE
Glucose, UA: NEGATIVE mg/dL
Hgb urine dipstick: NEGATIVE
Ketones, ur: NEGATIVE mg/dL
Nitrite: NEGATIVE
Protein, ur: NEGATIVE mg/dL
Specific Gravity, Urine: 1.009 (ref 1.005–1.030)
pH: 5 (ref 5.0–8.0)

## 2019-10-26 LAB — CBC
HCT: 39.7 % (ref 36.0–46.0)
Hemoglobin: 11.8 g/dL — ABNORMAL LOW (ref 12.0–15.0)
MCH: 28.6 pg (ref 26.0–34.0)
MCHC: 29.7 g/dL — ABNORMAL LOW (ref 30.0–36.0)
MCV: 96.1 fL (ref 80.0–100.0)
Platelets: 378 10*3/uL (ref 150–400)
RBC: 4.13 MIL/uL (ref 3.87–5.11)
RDW: 16 % — ABNORMAL HIGH (ref 11.5–15.5)
WBC: 8.4 10*3/uL (ref 4.0–10.5)
nRBC: 0 % (ref 0.0–0.2)

## 2019-10-26 LAB — BASIC METABOLIC PANEL
Anion gap: 15 (ref 5–15)
BUN: 27 mg/dL — ABNORMAL HIGH (ref 8–23)
CO2: 17 mmol/L — ABNORMAL LOW (ref 22–32)
Calcium: 10.7 mg/dL — ABNORMAL HIGH (ref 8.9–10.3)
Chloride: 101 mmol/L (ref 98–111)
Creatinine, Ser: 1.78 mg/dL — ABNORMAL HIGH (ref 0.44–1.00)
GFR calc Af Amer: 29 mL/min — ABNORMAL LOW (ref >60)
GFR calc non Af Amer: 25 mL/min — ABNORMAL LOW (ref >60)
Glucose, Bld: 107 mg/dL — ABNORMAL HIGH (ref 70–99)
Potassium: 5 mmol/L (ref 3.5–5.1)
Sodium: 133 mmol/L — ABNORMAL LOW (ref 135–145)

## 2019-10-26 LAB — GLUCOSE, CAPILLARY
Glucose-Capillary: 91 mg/dL (ref 70–99)
Glucose-Capillary: 84 mg/dL (ref 70–99)

## 2019-10-26 LAB — APTT: aPTT: 39 seconds — ABNORMAL HIGH (ref 24–36)

## 2019-10-26 LAB — PROTIME-INR
INR: 1.1 (ref 0.8–1.2)
Prothrombin Time: 13.5 seconds (ref 11.4–15.2)

## 2019-10-26 MED ORDER — BUPIVACAINE HCL (PF) 0.5 % IJ SOLN
INTRAMUSCULAR | Status: AC
  Filled 2019-10-26: qty 30

## 2019-10-26 MED ORDER — CEFAZOLIN SODIUM-DEXTROSE 2-4 GM/100ML-% IV SOLN: 2.0000 g | INTRAVENOUS | Status: AC

## 2019-10-26 MED ORDER — FENTANYL CITRATE (PF) 100 MCG/2ML IJ SOLN
INTRAMUSCULAR | Status: AC
  Filled 2019-10-26: qty 2

## 2019-10-26 MED ORDER — MIDAZOLAM HCL 2 MG/2ML IJ SOLN
INTRAMUSCULAR | Status: AC
  Filled 2019-10-26: qty 2

## 2019-10-26 MED ORDER — GELATIN ABSORBABLE 12-7 MM EX MISC
CUTANEOUS | Status: AC
  Filled 2019-10-26: qty 1

## 2019-10-26 MED ORDER — TOBRAMYCIN SULFATE 1.2 G IJ SOLR
INTRAMUSCULAR | Status: AC
  Filled 2019-10-26: qty 1.2

## 2019-10-26 MED ORDER — CEFAZOLIN SODIUM-DEXTROSE 2-4 GM/100ML-% IV SOLN
INTRAVENOUS | Status: AC
  Administered 2019-10-26: 2 g via INTRAVENOUS
  Filled 2019-10-26: qty 100

## 2019-10-26 MED ORDER — FENTANYL CITRATE (PF) 100 MCG/2ML IJ SOLN
INTRAMUSCULAR | Status: AC | PRN
  Administered 2019-10-26 (×3): 25 ug via INTRAVENOUS

## 2019-10-26 MED ORDER — MIDAZOLAM HCL 2 MG/2ML IJ SOLN
INTRAMUSCULAR | Status: AC | PRN
  Administered 2019-10-26 (×2): 1 mg via INTRAVENOUS

## 2019-10-26 MED ORDER — SODIUM CHLORIDE 0.9 % IV SOLN: INTRAVENOUS | Status: DC

## 2019-10-26 MED ORDER — SODIUM CHLORIDE 0.9 % IV SOLN: INTRAVENOUS | Status: AC

## 2019-10-26 MED ORDER — IOHEXOL 300 MG/ML  SOLN
50.0000 mL | Freq: Once | INTRAMUSCULAR | Status: AC | PRN
  Administered 2019-10-26: 4 mL

## 2019-10-26 MED ORDER — BUPIVACAINE HCL 0.25 % IJ SOLN
INTRAMUSCULAR | Status: AC | PRN
  Administered 2019-10-26: 30 mL

## 2019-10-26 NOTE — Procedures (Signed)
S/P sacroplasty via bilateral approach. S.Daveon Arpino MD

## 2019-10-26 NOTE — Discharge Instructions (Signed)
1.No stooping,or bending or lifting more than 10 lbs for 2 weeks. 2.No driving for 2 weeks. 3.Use walker to ambulate for 2 weeks. 4.RTC PRN 2 weeks.   Balloon Kyphoplasty, Care After This sheet gives you information about how to care for yourself after your procedure. Your health care provider may also give you more specific instructions. If you have problems or questions, contact your health care provider. What can I expect after the procedure? After your procedure, it is common to have back pain. Follow these instructions at home: Medicines  Take over-the-counter and prescription medicines only as told by your health care provider.  Ask your health care provider if the medicine prescribed to you: ? Requires you to avoid driving or using heavy machinery. ? Can cause constipation. You may need to take steps to prevent or treat constipation, such as:  Drink enough fluid to keep your urine pale yellow.  Take over-the-counter or prescription medicines.  Eat foods that are high in fiber, such as beans, whole grains, and fresh fruits and vegetables.  Limit foods that are high in fat and processed sugars, such as fried or sweet foods. Puncture site care   Follow instructions from your health care provider about how to take care of your puncture site. Make sure you: ? Wash your hands with soap and water before and after you change your bandage (dressing). If soap and water are not available, use hand sanitizer. ? Remove your dressing as told by your health care provider. In 24 hours ? Leave skin glue or adhesive strips in place. These skin closures may need to be in place for 2 weeks or longer. If adhesive strip edges start to loosen and curl up, you may trim the loose edges. Do not remove adhesive strips completely unless your health care provider tells you to do that.  Check your puncture site every day for signs of infection. Watch for: ? Redness, swelling, or pain. ? Fluid or  blood. ? Warmth. ? Pus or a bad smell.  Keep your dressing dry until your health care provider says that it can be removed. Managing pain, stiffness, and swelling   If directed, put ice on the painful area. ? Put ice in a plastic bag. ? Place a towel between your skin and the bag. ? Leave the ice on for 20 minutes, 2-3 times a day. Activity  Rest your back and avoid intense physical activity for as long as told by your health care provider.  Avoid bending, lifting, or twisting your back for as long as told by your health care provider.  Return to your normal activities as told by your health care provider. Ask your health care provider what activities are safe for you.  Do not lift anything that is heavier than 5 lb (2.2 kg). You may need to avoid heavy lifting for several weeks. General instructions  Do not use any products that contain nicotine or tobacco, such as cigarettes, e-cigarettes, and chewing tobacco. These can delay bone healing. If you need help quitting, ask your health care provider.  Do not drive for 24 hours if you were given a sedative during your procedure.  Keep all follow-up visits as told by your health care provider. This is important. Contact a health care provider if:  You have a fever or chills.  You have redness, swelling, or pain at the site of your puncture.  You have fluid, blood, or pus coming from the puncture site.  You have pain  that gets worse or does not get better with medicine.  You develop numbness or weakness in any part of your body. Get help right away if:  You have chest pain.  You have difficulty breathing.  You have weakness, numbness, or tingling in your legs.  You cannot control your bladder or bowel movements.  You suddenly become weak or numb on one side of your body.  You become very confused.  You have trouble speaking or understanding, or both. Summary  Follow instructions from your health care provider about  how to take care of your puncture site.  Take over-the-counter and prescription medicines only as told by your health care provider.  Rest your back and avoid intense physical activity for as long as told by your health care provider.  Contact a health care provider if you have pain that gets worse or does not get better with medicine.  Keep all follow-up visits as told by your health care provider. This is important. This information is not intended to replace advice given to you by your health care provider. Make sure you discuss any questions you have with your health care provider. Document Revised: 12/29/2017 Document Reviewed: 12/29/2017 Elsevier Patient Education  2020 [employer].

## 2019-10-26 NOTE — H&P (Signed)
Chief Complaint: Patient was seen in consultation today for bilateral sacroplasty at the request of Dr Nelva Bush   Supervising Physician: Luanne Bras  Patient Status: Rebecca Sellers - Out-pt  History of Present Illness: Rebecca Sellers is a 86 y.o. female   Hx Colon Ca Fell at home 13 weeks ago Has used Tylenol without relief Changed to Hydrocodone just last week--- minimal relief Using walker at home to get around Almost any movement hurts very low in back  MRI from [facility] 10/06/19--- revealing Bilat sacral fractures  Dr Nelva Bush has referred pt to Dr Estanislado Pandy for B Sacroplasty - today   Past Medical History:  Diagnosis Date  . Bilateral swelling of feet   . Bladder problem   . Bruises easily   . Cancer ([place])    colon  . Carpal tunnel syndrome 02/20/2015   Bilateral  . Colon cancer ([place]) 09/29/2017   surgery only  . Diabetes ([place])    no meds.  . Fatigue   . GERD (gastroesophageal reflux disease)   . Hypertension   . Hypothyroidism   . Memory loss   . Osteoarthritis   . Osteoporosis   . Reflux   . Thyroid disease     Past Surgical History:  Procedure Laterality Date  . ABDOMINAL HYSTERECTOMY    . CHOLECYSTECTOMY    . EYE SURGERY     bilateral cataract with lens implnats  . LAPAROSCOPIC PARTIAL COLECTOMY N/A 09/29/2017   Procedure: LAPAROSCOPIC PARTIAL COLECTOMY;  Surgeon: Johnathan Hausen, MD;  Location: [facility];  Service: General;  Laterality: N/A;    Allergies: Alprazolam, Garlic, Mobic [meloxicam], Relafen [nabumetone], Sulfa antibiotics, and Trazodone and nefazodone  Medications: Prior to Admission medications   Medication Sig Start Date End Date Taking? Authorizing Provider  acetaminophen (TYLENOL) 650 MG CR tablet Take 1,300 mg by mouth every 8 (eight) hours as needed for pain.   Yes [provider]  amLODipine (NORVASC) 2.5 MG tablet Take 2.5 mg by mouth daily.   Yes [provider]  ascorbic acid (VITAMIN C) 500 MG  tablet Take 500 mg by mouth daily.   Yes [provider]  cholecalciferol (VITAMIN D) 1000 UNITS tablet Take 1,000 Units by mouth daily.   Yes [provider]  diphenhydramine-acetaminophen (TYLENOL PM) 25-500 MG TABS tablet Take 2 tablets by mouth at bedtime.   Yes [provider]  fenofibrate 160 MG tablet Take 160 mg by mouth daily.   Yes [provider]  fluticasone (FLONASE) 50 MCG/ACT nasal spray Place 2 sprays into both nostrils daily as needed for allergies.    Yes [provider]  HYDROcodone-acetaminophen (NORCO) 10-325 MG tablet Take 1 tablet by mouth every 6 (six) hours as needed for moderate pain.   Yes [provider]  levothyroxine (SYNTHROID, LEVOTHROID) 75 MCG tablet Take 75 mcg by mouth daily before breakfast.   Yes [provider]  lisinopril (PRINIVIL,ZESTRIL) 40 MG tablet Take 40 mg by mouth daily.   Yes [provider]  Multiple Vitamin (MULTIVITAMIN WITH MINERALS) TABS tablet Take 1 tablet by mouth daily.   Yes [provider]  omeprazole (PRILOSEC) 20 MG capsule Take 20 mg by mouth daily.   Yes [provider]  OVER THE COUNTER MEDICATION Apply 1 patch topically daily as needed (back pain). thermacare heat wrap with lidocaine   Yes [provider]  Polyethyl Glycol-Propyl Glycol (SYSTANE OP) Place 1 drop into both eyes 3 (three) times daily.    Yes  [provider]  pravastatin (PRAVACHOL) 10 MG tablet Take 10 mg by mouth daily.   Yes [provider]  vitamin B-12 (CYANOCOBALAMIN) 1000 MCG tablet Take 1,000 mcg by mouth daily.   Yes [provider]     Family History  Problem Relation Age of Onset  . Breast cancer Mother 13  . Heart disease Father     Social History   Socioeconomic History  . Marital status: Widowed    Spouse name: Not on file  . Number of children: Not on file  . Years of education: Not on file  . Highest education level:  Not on file  Occupational History  . Not on file  Tobacco Use  . Smoking status: Former Smoker    Types: Cigarettes  . Smokeless tobacco: Never Used  Vaping Use  . Vaping Use: Never used  Substance and Sexual Activity  . Alcohol use: Yes    Comment: social  . Drug use: No  . Sexual activity: Not on file  Other Topics Concern  . Not on file  Social History Narrative  . Not on file   Social Determinants of Health   Financial Resource Strain:   . Difficulty of Paying Living Expenses: Not on file  Food Insecurity:   . Worried About Charity fundraiser in the Last Year: Not on file  . Ran Out of Food in the Last Year: Not on file  Transportation Needs:   . Lack of Transportation (Medical): Not on file  . Lack of Transportation (Non-Medical): Not on file  Physical Activity:   . Days of Exercise per Week: Not on file  . Minutes of Exercise per Session: Not on file  Stress:   . Feeling of Stress : Not on file  Social Connections:   . Frequency of Communication with Friends and Family: Not on file  . Frequency of Social Gatherings with Friends and Family: Not on file  . Attends Religious Services: Not on file  . Active Member of Clubs or Organizations: Not on file  . Attends Archivist Meetings: Not on file  . Marital Status: Not on file     Review of Systems: A 12 point ROS discussed and pertinent positives are indicated in the HPI above.  All other systems are negative.  Review of Systems  Constitutional: Positive for activity change. Negative for fatigue.  Respiratory: Negative for cough and shortness of breath.   Gastrointestinal: Negative for abdominal pain.  Musculoskeletal: Positive for back pain and gait problem.  Neurological: Positive for weakness.  Psychiatric/Behavioral: Negative for behavioral problems and confusion.    Vital Signs: BP (!) 147/76   Pulse 99   Temp 98.1 F (36.7 C) (Oral)   Resp 15   Ht 5\' 1"  (1.549 m)   Wt 125 lb (56.7 kg)    SpO2 100%   BMI 23.62 kg/m   Physical Exam Vitals reviewed.  HENT:     Mouth/Throat:     Mouth: Mucous membranes are moist.  Cardiovascular:     Rate and Rhythm: Normal rate and regular rhythm.     Heart sounds: Normal heart sounds.  Pulmonary:     Effort: Pulmonary effort is normal.     Breath sounds: Normal breath sounds.  Abdominal:     Palpations: Abdomen is soft.  Musculoskeletal:        General: Normal range of motion.     Comments: Low back pain  Skin:    General: Skin  is warm.  Neurological:     Mental Status: She is alert and oriented to person, place, and time.  Psychiatric:        Behavior: Behavior normal.     Imaging: No results found.  Labs:  CBC: No results for input(s): WBC, HGB, HCT, PLT in the last 8760 hours.  COAGS: No results for input(s): INR, APTT in the last 8760 hours.  BMP: No results for input(s): NA, K, CL, CO2, GLUCOSE, BUN, CALCIUM, CREATININE, GFRNONAA, GFRAA in the last 8760 hours.  Invalid input(s): CMP  LIVER FUNCTION TESTS: No results for input(s): BILITOT, AST, ALT, ALKPHOS, PROT, ALBUMIN in the last 8760 hours.  TUMOR MARKERS: No results for input(s): AFPTM, CEA, CA199, CHROMGRNA in the last 8760 hours.  Assessment and Plan:  Bilateral painful sacral fractures Scheduled for sacroplasty today in IR Risks and benefits of Sacroplasty were discussed with the patient including, but not limited to education regarding the natural healing process of compression fractures without intervention, bleeding, infection, cement migration which may cause spinal cord damage, paralysis, pulmonary embolism or even death.  This interventional procedure involves the use of X-rays and because of the nature of the planned procedure, it is possible that we will have prolonged use of X-ray fluoroscopy.  Potential radiation risks to you include (but are not limited to) the following: - A slightly elevated risk for cancer  several years later in  life. This risk is typically less than 0.5% percent. This risk is low in comparison to the normal incidence of human cancer, which is 33% for women and 50% for men according to the [place]. - Radiation induced injury can include skin redness, resembling a rash, tissue breakdown / ulcers and hair loss (which can be temporary or permanent).   The likelihood of either of these occurring depends on the difficulty of the procedure and whether you are sensitive to radiation due to previous procedures, disease, or genetic conditions.   IF your procedure requires a prolonged use of radiation, you will be notified and given written instructions for further action.  It is your responsibility to monitor the irradiated area for the 2 weeks following the procedure and to notify your physician if you are concerned that you have suffered a radiation induced injury.    All of the patient's questions were answered, patient is agreeable to proceed.  Consent signed and in chart.  Thank you for this interesting consult.  I greatly enjoyed meeting JILL STOPKA and look forward to participating in their care.  A copy of this report was sent to the requesting provider on this date.  Electronically Signed: Lavonia Drafts, PA-C 10/26/2019, 10:13 AM   I spent a total of  30 Minutes   in face to face in clinical consultation, greater than 50% of which was counseling/coordinating care for Bilat Sacroplasty

## 2019-11-08 ENCOUNTER — Telehealth (HOSPITAL_COMMUNITY): Payer: Self-pay | Admitting: Radiology

## 2019-11-08 NOTE — Telephone Encounter (Signed)
Pt called to ask if she had to come back in for a follow-up visit. She states she is much better with just a little soreness still. She does not have to come in for follow-up unless she develops problems. Pt agrees with this plan of care. JM

## 2019-11-16 DIAGNOSIS — M8438XD Stress fracture, other site, subsequent encounter for fracture with routine healing: Secondary | ICD-10-CM | POA: Diagnosis not present

## 2019-11-18 DIAGNOSIS — Z23 Encounter for immunization: Secondary | ICD-10-CM | POA: Diagnosis not present

## 2019-11-22 DIAGNOSIS — Z0000 Encounter for general adult medical examination without abnormal findings: Secondary | ICD-10-CM | POA: Diagnosis not present

## 2019-11-22 DIAGNOSIS — E039 Hypothyroidism, unspecified: Secondary | ICD-10-CM | POA: Diagnosis not present

## 2019-11-22 DIAGNOSIS — E1122 Type 2 diabetes mellitus with diabetic chronic kidney disease: Secondary | ICD-10-CM | POA: Diagnosis not present

## 2019-11-22 DIAGNOSIS — G629 Polyneuropathy, unspecified: Secondary | ICD-10-CM | POA: Diagnosis not present

## 2019-11-22 DIAGNOSIS — H34812 Central retinal vein occlusion, left eye, with macular edema: Secondary | ICD-10-CM | POA: Diagnosis not present

## 2019-11-22 DIAGNOSIS — Z23 Encounter for immunization: Secondary | ICD-10-CM | POA: Diagnosis not present

## 2019-11-22 DIAGNOSIS — R82998 Other abnormal findings in urine: Secondary | ICD-10-CM | POA: Diagnosis not present

## 2019-11-22 DIAGNOSIS — N184 Chronic kidney disease, stage 4 (severe): Secondary | ICD-10-CM | POA: Diagnosis not present

## 2019-11-22 DIAGNOSIS — E785 Hyperlipidemia, unspecified: Secondary | ICD-10-CM | POA: Diagnosis not present

## 2019-11-22 DIAGNOSIS — R4701 Aphasia: Secondary | ICD-10-CM | POA: Diagnosis not present

## 2019-11-22 DIAGNOSIS — C189 Malignant neoplasm of colon, unspecified: Secondary | ICD-10-CM | POA: Diagnosis not present

## 2019-11-22 DIAGNOSIS — D692 Other nonthrombocytopenic purpura: Secondary | ICD-10-CM | POA: Diagnosis not present

## 2019-11-22 DIAGNOSIS — I8390 Asymptomatic varicose veins of unspecified lower extremity: Secondary | ICD-10-CM | POA: Diagnosis not present

## 2019-11-22 DIAGNOSIS — M109 Gout, unspecified: Secondary | ICD-10-CM | POA: Diagnosis not present

## 2019-11-22 DIAGNOSIS — E7849 Other hyperlipidemia: Secondary | ICD-10-CM | POA: Diagnosis not present

## 2019-11-22 DIAGNOSIS — M810 Age-related osteoporosis without current pathological fracture: Secondary | ICD-10-CM | POA: Diagnosis not present

## 2019-11-22 DIAGNOSIS — R350 Frequency of micturition: Secondary | ICD-10-CM | POA: Diagnosis not present

## 2019-11-22 DIAGNOSIS — I700 Atherosclerosis of aorta: Secondary | ICD-10-CM | POA: Diagnosis not present

## 2019-11-22 DIAGNOSIS — E46 Unspecified protein-calorie malnutrition: Secondary | ICD-10-CM | POA: Diagnosis not present

## 2019-11-22 DIAGNOSIS — R413 Other amnesia: Secondary | ICD-10-CM | POA: Diagnosis not present

## 2019-11-22 DIAGNOSIS — I129 Hypertensive chronic kidney disease with stage 1 through stage 4 chronic kidney disease, or unspecified chronic kidney disease: Secondary | ICD-10-CM | POA: Diagnosis not present

## 2019-11-24 ENCOUNTER — Other Ambulatory Visit: Payer: Self-pay | Admitting: Internal Medicine

## 2019-11-24 DIAGNOSIS — Z1231 Encounter for screening mammogram for malignant neoplasm of breast: Secondary | ICD-10-CM

## 2019-12-09 ENCOUNTER — Emergency Department: Payer: Medicare Other

## 2019-12-09 ENCOUNTER — Other Ambulatory Visit: Payer: Self-pay

## 2019-12-09 ENCOUNTER — Observation Stay
Admission: EM | Admit: 2019-12-09 | Discharge: 2019-12-09 | Disposition: A | Discharge status: Home / Self Care | Payer: Medicare Other | Attending: Internal Medicine | Admitting: Internal Medicine

## 2019-12-09 DIAGNOSIS — R4182 Altered mental status, unspecified: Secondary | ICD-10-CM | POA: Diagnosis present

## 2019-12-09 DIAGNOSIS — G9349 Other encephalopathy: Secondary | ICD-10-CM | POA: Insufficient documentation

## 2019-12-09 DIAGNOSIS — R410 Disorientation, unspecified: Secondary | ICD-10-CM | POA: Diagnosis not present

## 2019-12-09 DIAGNOSIS — R6521 Severe sepsis with septic shock: Secondary | ICD-10-CM | POA: Diagnosis not present

## 2019-12-09 DIAGNOSIS — D696 Thrombocytopenia, unspecified: Secondary | ICD-10-CM | POA: Diagnosis present

## 2019-12-09 DIAGNOSIS — Z87891 Personal history of nicotine dependence: Secondary | ICD-10-CM | POA: Insufficient documentation

## 2019-12-09 DIAGNOSIS — B9620 Unspecified Escherichia coli [E. coli] as the cause of diseases classified elsewhere: Secondary | ICD-10-CM

## 2019-12-09 DIAGNOSIS — I10 Essential (primary) hypertension: Secondary | ICD-10-CM | POA: Diagnosis not present

## 2019-12-09 DIAGNOSIS — G9340 Encephalopathy, unspecified: Secondary | ICD-10-CM

## 2019-12-09 DIAGNOSIS — R109 Unspecified abdominal pain: Secondary | ICD-10-CM | POA: Diagnosis not present

## 2019-12-09 DIAGNOSIS — R1084 Generalized abdominal pain: Secondary | ICD-10-CM | POA: Diagnosis not present

## 2019-12-09 DIAGNOSIS — Z9889 Other specified postprocedural states: Secondary | ICD-10-CM | POA: Diagnosis not present

## 2019-12-09 DIAGNOSIS — I4891 Unspecified atrial fibrillation: Secondary | ICD-10-CM | POA: Diagnosis not present

## 2019-12-09 DIAGNOSIS — E039 Hypothyroidism, unspecified: Secondary | ICD-10-CM | POA: Diagnosis not present

## 2019-12-09 DIAGNOSIS — R7881 Bacteremia: Secondary | ICD-10-CM

## 2019-12-09 DIAGNOSIS — A419 Sepsis, unspecified organism: Secondary | ICD-10-CM | POA: Diagnosis not present

## 2019-12-09 DIAGNOSIS — Z20822 Contact with and (suspected) exposure to covid-19: Secondary | ICD-10-CM | POA: Diagnosis not present

## 2019-12-09 DIAGNOSIS — I491 Atrial premature depolarization: Secondary | ICD-10-CM | POA: Diagnosis not present

## 2019-12-09 DIAGNOSIS — Z85038 Personal history of other malignant neoplasm of large intestine: Secondary | ICD-10-CM | POA: Insufficient documentation

## 2019-12-09 DIAGNOSIS — R6520 Severe sepsis without septic shock: Secondary | ICD-10-CM

## 2019-12-09 DIAGNOSIS — N179 Acute kidney failure, unspecified: Secondary | ICD-10-CM | POA: Diagnosis present

## 2019-12-09 DIAGNOSIS — Z79899 Other long term (current) drug therapy: Secondary | ICD-10-CM | POA: Diagnosis not present

## 2019-12-09 DIAGNOSIS — R400 Somnolence: Secondary | ICD-10-CM

## 2019-12-09 DIAGNOSIS — R7989 Other specified abnormal findings of blood chemistry: Secondary | ICD-10-CM | POA: Diagnosis present

## 2019-12-09 DIAGNOSIS — R52 Pain, unspecified: Secondary | ICD-10-CM | POA: Diagnosis not present

## 2019-12-09 DIAGNOSIS — R404 Transient alteration of awareness: Secondary | ICD-10-CM | POA: Diagnosis not present

## 2019-12-09 DIAGNOSIS — R188 Other ascites: Secondary | ICD-10-CM | POA: Diagnosis not present

## 2019-12-09 DIAGNOSIS — Z0389 Encounter for observation for other suspected diseases and conditions ruled out: Secondary | ICD-10-CM | POA: Diagnosis not present

## 2019-12-09 LAB — URINALYSIS, COMPLETE (UACMP) WITH MICROSCOPIC
Bilirubin Urine: NEGATIVE
Glucose, UA: NEGATIVE mg/dL
Hgb urine dipstick: NEGATIVE
Ketones, ur: NEGATIVE mg/dL
Leukocytes,Ua: NEGATIVE
Nitrite: NEGATIVE
Protein, ur: 30 mg/dL — AB
Specific Gravity, Urine: 1.019 (ref 1.005–1.030)
pH: 5 (ref 5.0–8.0)

## 2019-12-09 LAB — CBC WITH DIFFERENTIAL/PLATELET
Abs Immature Granulocytes: 0.17 10*3/uL — ABNORMAL HIGH (ref 0.00–0.07)
Basophils Absolute: 0 10*3/uL (ref 0.0–0.1)
Basophils Relative: 1 %
Eosinophils Absolute: 0 10*3/uL (ref 0.0–0.5)
Eosinophils Relative: 0 %
HCT: 33.3 % — ABNORMAL LOW (ref 36.0–46.0)
Hemoglobin: 10.2 g/dL — ABNORMAL LOW (ref 12.0–15.0)
Immature Granulocytes: 2 %
Lymphocytes Relative: 8 %
Lymphs Abs: 0.7 10*3/uL (ref 0.7–4.0)
MCH: 27.6 pg (ref 26.0–34.0)
MCHC: 30.6 g/dL (ref 30.0–36.0)
MCV: 90.2 fL (ref 80.0–100.0)
Monocytes Absolute: 0.6 10*3/uL (ref 0.1–1.0)
Monocytes Relative: 7 %
Neutro Abs: 7.3 10*3/uL (ref 1.7–7.7)
Neutrophils Relative %: 82 %
Platelets: 38 10*3/uL — ABNORMAL LOW (ref 150–400)
RBC: 3.69 MIL/uL — ABNORMAL LOW (ref 3.87–5.11)
RDW: 16.3 % — ABNORMAL HIGH (ref 11.5–15.5)
Smear Review: NORMAL
WBC: 8.8 10*3/uL (ref 4.0–10.5)
nRBC: 0.7 % — ABNORMAL HIGH (ref 0.0–0.2)

## 2019-12-09 LAB — BLOOD CULTURE ID PANEL (REFLEXED) - BCID2

## 2019-12-09 LAB — COMPREHENSIVE METABOLIC PANEL
ALT: 40 U/L (ref 0–44)
AST: 104 U/L — ABNORMAL HIGH (ref 15–41)
Albumin: 2.5 g/dL — ABNORMAL LOW (ref 3.5–5.0)
Alkaline Phosphatase: 137 U/L — ABNORMAL HIGH (ref 38–126)
BUN: 75 mg/dL — ABNORMAL HIGH (ref 8–23)
CO2: <7 mmol/L — ABNORMAL LOW (ref 22–32)
Calcium: 8.2 mg/dL — ABNORMAL LOW (ref 8.9–10.3)
Chloride: 91 mmol/L — ABNORMAL LOW (ref 98–111)
Creatinine, Ser: 4.26 mg/dL — ABNORMAL HIGH (ref 0.44–1.00)
GFR, Estimated: 10 mL/min — ABNORMAL LOW (ref >60)
Glucose, Bld: 87 mg/dL (ref 70–99)
Potassium: 5.9 mmol/L — ABNORMAL HIGH (ref 3.5–5.1)
Sodium: 122 mmol/L — ABNORMAL LOW (ref 135–145)
Total Bilirubin: 2.3 mg/dL — ABNORMAL HIGH (ref 0.3–1.2)
Total Protein: 6.2 g/dL — ABNORMAL LOW (ref 6.5–8.1)

## 2019-12-09 LAB — LACTIC ACID, PLASMA
Lactic Acid, Venous: >11 mmol/L (ref 0.5–1.9)
Lactic Acid, Venous: >11 mmol/L (ref 0.5–1.9)
Lactic Acid, Venous: 8.4 mmol/L (ref 0.5–1.9)

## 2019-12-09 LAB — AMMONIA: Ammonia: 68 umol/L — ABNORMAL HIGH (ref 9–35)

## 2019-12-09 LAB — PROTIME-INR
INR: 1.8 — ABNORMAL HIGH (ref 0.8–1.2)
Prothrombin Time: 20.2 seconds — ABNORMAL HIGH (ref 11.4–15.2)

## 2019-12-09 LAB — TSH: TSH: 0.361 u[IU]/mL (ref 0.350–4.500)

## 2019-12-09 LAB — APTT: aPTT: 41 seconds — ABNORMAL HIGH (ref 24–36)

## 2019-12-09 LAB — RESPIRATORY PANEL BY RT PCR (FLU A&B, COVID)
Influenza A by PCR: NEGATIVE
Influenza B by PCR: NEGATIVE
SARS Coronavirus 2 by RT PCR: NEGATIVE

## 2019-12-09 MED ORDER — NOREPINEPHRINE 4 MG/250ML-% IV SOLN
2.0000 ug/min | INTRAVENOUS | Status: DC
  Administered 2019-12-09: 2 ug/min via INTRAVENOUS
  Filled 2019-12-09: qty 250

## 2019-12-09 MED ORDER — VITAMIN B-12 1000 MCG PO TABS
1000.0000 ug | ORAL_TABLET | Freq: Every day | ORAL | Status: DC
  Filled 2019-12-09 (×2): qty 1

## 2019-12-09 MED ORDER — HEPARIN SODIUM (PORCINE) 5000 UNIT/ML IJ SOLN: 5000.0000 [IU] | Freq: Three times a day (TID) | INTRAMUSCULAR | Status: DC

## 2019-12-09 MED ORDER — SODIUM CHLORIDE 0.9 % IV SOLN
1.0000 g | INTRAVENOUS | Status: DC
  Administered 2019-12-09: 1 g via INTRAVENOUS
  Filled 2019-12-09: qty 10

## 2019-12-09 MED ORDER — VANCOMYCIN VARIABLE DOSE PER UNSTABLE RENAL FUNCTION (PHARMACIST DOSING): Status: DC

## 2019-12-09 MED ORDER — SODIUM CHLORIDE 0.9 % IV BOLUS (SEPSIS)
1000.0000 mL | Freq: Once | INTRAVENOUS | Status: AC
  Administered 2019-12-09: 1000 mL via INTRAVENOUS

## 2019-12-09 MED ORDER — VANCOMYCIN HCL 500 MG/100ML IV SOLN
500.0000 mg | Freq: Once | INTRAVENOUS | Status: DC
  Filled 2019-12-09: qty 100

## 2019-12-09 MED ORDER — PRAVASTATIN SODIUM 10 MG PO TABS
10.0000 mg | ORAL_TABLET | Freq: Every day | ORAL | Status: DC
  Filled 2019-12-09 (×2): qty 1

## 2019-12-09 MED ORDER — LACTATED RINGERS IV SOLN: INTRAVENOUS | Status: DC

## 2019-12-09 MED ORDER — VANCOMYCIN HCL IN DEXTROSE 1-5 GM/200ML-% IV SOLN
1000.0000 mg | Freq: Once | INTRAVENOUS | Status: AC
  Administered 2019-12-09: 1000 mg via INTRAVENOUS
  Filled 2019-12-09: qty 200

## 2019-12-09 MED ORDER — NOREPINEPHRINE 4 MG/250ML-% IV SOLN: 0.0000 ug/min | INTRAVENOUS | Status: DC

## 2019-12-09 MED ORDER — LACTATED RINGERS IV BOLUS (SEPSIS)
1000.0000 mL | Freq: Once | INTRAVENOUS | Status: AC
  Administered 2019-12-09: 1000 mL via INTRAVENOUS

## 2019-12-09 MED ORDER — LEVOTHYROXINE SODIUM 50 MCG PO TABS: 75.0000 ug | ORAL_TABLET | Freq: Every day | ORAL | Status: DC

## 2019-12-09 MED ORDER — METRONIDAZOLE IN NACL 5-0.79 MG/ML-% IV SOLN
500.0000 mg | Freq: Once | INTRAVENOUS | Status: AC
  Administered 2019-12-09: 500 mg via INTRAVENOUS
  Filled 2019-12-09: qty 100

## 2019-12-09 MED ORDER — SODIUM CHLORIDE 0.9 % IV SOLN
2.0000 g | Freq: Once | INTRAVENOUS | Status: AC
  Administered 2019-12-09: 2 g via INTRAVENOUS
  Filled 2019-12-09: qty 2

## 2019-12-09 MED ORDER — SODIUM CHLORIDE 0.9 % IV SOLN: 250.0000 mL | INTRAVENOUS | Status: DC

## 2019-12-10 LAB — PROCALCITONIN: Procalcitonin: 42.12 ng/mL

## 2019-12-11 LAB — URINE CULTURE: Culture: 40000 — AB

## 2019-12-12 LAB — CULTURE, BLOOD (SINGLE)
Special Requests: ADEQUATE
Special Requests: ADEQUATE

## 2019-12-15 ENCOUNTER — Encounter (HOSPITAL_COMMUNITY): Payer: Medicare Other

## 2020-01-05 NOTE — ED Notes (Signed)
Lab at bedside

## 2020-01-05 NOTE — Progress Notes (Signed)
Pharmacy Antibiotic Note  Rebecca Sellers is a 86 y.o. female admitted on 12-19-2019 with AMS. Patient with recent kyphoplasty several weeks ago. Pharmacy has been consulted for vancomycin dosing for osteomyelitis.  Creatinine 4.26 on admission. Significantly elevated from previous visits, with most recent 1.78 in September. It appears baseline ~ 1.7.  Plan: Vancomycin 1000 mg given as part of initial sepsis workup. Will add 500 mg for approximate loading dose. Follow up renal function in the morning and plan to dose accordingly or draw level as indicated.  Height: 5\' 5"  (165.1 cm) Weight: 62.5 kg (137 lb 12.6 oz) IBW/kg (Calculated) : 57  Temp (24hrs), Avg:93.1 F (33.9 C), Min:93 F (33.9 C), Max:93.2 F (34 C)  Recent Labs  Lab 12/19/2019 1057 12-19-2019 1501 Dec 19, 2019 1739  WBC 8.8  --   --   CREATININE 4.26*  --   --   LATICACIDVEN >11.0* >11.0* 8.4*    Estimated Creatinine Clearance: 8.4 mL/min (A) (by C-G formula based on SCr of 4.26 mg/dL (H)).    Allergies  Allergen Reactions   Alprazolam Diarrhea   Garlic Diarrhea and Nausea And Vomiting   Mobic [Meloxicam] Diarrhea   Relafen [Nabumetone] Diarrhea   Sulfa Antibiotics Other (See Comments)    fever   Trazodone And Nefazodone Diarrhea    Antimicrobials this admission: Vancomycin 11/4 >> Ceftriaxone 11/4 >>   Microbiology results: 11/4 BCx: pending 11/4 UCx: pending    Thank you for allowing pharmacy to be a part of this patients care.  Tawnya Crook, PharmD Dec 19, 2019 6:56 PM

## 2020-01-05 NOTE — H&P (Signed)
History and Physical   MARCA GADSBY EHO:[id number] DOB: 07-25-1930 DOA: 2019/12/25  PCP: Shon Baton, MD  Patient coming from: home  I have personally briefly reviewed patient's old medical records in [place].  Chief Concern: altered mental status  HPI: OCIE STANZIONE is a 86 y.o. female with medical history significant for hypertension, hypothyroid, hyperlipidemia, recent sacral alla fractures status post sacral plasty approximately 2 to 3 weeks ago presented to the emergency department for mental status change.  Minimal history was obtained due to acute mental status change.  She was able to tell me her name and age.  Per second healthcare power of attorney at bedside, Netty Starring, she reports that the housekeeper found Ms. Polidore antibiotic bottle.  Per Kenney Houseman, patient was diagnosed with urinary tract infection on 11/29/2019.  And per count of the antibiotics, patient has not been taking the antibiotic as recommended.  On physical exam of her back palpation of the lumbosacral area elicited some discomfort.  Negative visually for bruising, discharge, or palpable crepitus.  ED Course: discussed with ED provider.  Patient presented with sepsis-like picture.  She received 2 L lactated Ringer bolus and 1 L normal saline bolus in the ED.  Broad-spectrum antibiotic was started with vancomycin, cefepime, metronidazole.  CT the head was ordered was read as no evidence of acute intracranial abnormality.  Mild cerebral atrophy and chronic small vessel ischemic disease.  Review of Systems: Unable to be obtained due to altered mentation.  Past Medical History:  Diagnosis Date  . Bilateral swelling of feet   . Bladder problem   . Bruises easily   . Cancer ([place])    colon  . Carpal tunnel syndrome 02/20/2015   Bilateral  . Colon cancer ([place]) 09/29/2017   surgery only  . Diabetes ([place])    no meds.  . Fatigue   . GERD (gastroesophageal reflux disease)   . Hypertension   .  Hypothyroidism   . Memory loss   . Osteoarthritis   . Osteoporosis   . Reflux   . Thyroid disease    Past Surgical History:  Procedure Laterality Date  . ABDOMINAL HYSTERECTOMY    . CHOLECYSTECTOMY    . EYE SURGERY     bilateral cataract with lens implnats  . IR SACROPLASTY BILATERAL  10/26/2019  . LAPAROSCOPIC PARTIAL COLECTOMY N/A 09/29/2017   Procedure: LAPAROSCOPIC PARTIAL COLECTOMY;  Surgeon: Johnathan Hausen, MD;  Location: [facility];  Service: General;  Laterality: N/A;   Social History:  reports that she has quit smoking. Her smoking use included cigarettes. She has never used smokeless tobacco. She reports current alcohol use. She reports that she does not use drugs.  Allergies  Allergen Reactions  . Alprazolam Diarrhea  . Garlic Diarrhea and Nausea And Vomiting  . Mobic [Meloxicam] Diarrhea  . Relafen [Nabumetone] Diarrhea  . Sulfa Antibiotics Other (See Comments)    fever  . Trazodone And Nefazodone Diarrhea   Family History  Problem Relation Age of Onset  . Breast cancer Mother 84  . Heart disease Father    Family history: Family history reviewed and heart disease in father  Prior to Admission medications   Medication Sig Start Date End Date Taking? Authorizing Provider  acetaminophen (TYLENOL) 650 MG CR tablet Take 1,300 mg by mouth every 8 (eight) hours as needed for pain.   Yes [provider]  amLODipine (NORVASC) 2.5 MG tablet Take 2.5 mg by mouth daily.   Yes [provider]  fenofibrate  160 MG tablet Take 160 mg by mouth daily.   Yes [provider]  fluticasone (FLONASE) 50 MCG/ACT nasal spray Place 2 sprays into both nostrils daily as needed for allergies.    Yes [provider]  HYDROcodone-acetaminophen (NORCO) 10-325 MG tablet Take 1 tablet by mouth every 6 (six) hours as needed for moderate pain.   Yes [provider]  levothyroxine (SYNTHROID, LEVOTHROID) 75 MCG tablet Take 75 mcg by mouth daily before  breakfast.   Yes [provider]  lisinopril (PRINIVIL,ZESTRIL) 40 MG tablet Take 40 mg by mouth daily.   Yes [provider]  omeprazole (PRILOSEC) 20 MG capsule Take 20 mg by mouth daily.   Yes [provider]  Polyethyl Glycol-Propyl Glycol (SYSTANE OP) Place 1 drop into both eyes 3 (three) times daily.    Yes [provider]  pravastatin (PRAVACHOL) 10 MG tablet Take 10 mg by mouth daily.   Yes [provider]  ascorbic acid (VITAMIN C) 500 MG tablet Take 500 mg by mouth daily. Patient not taking: Reported on 2019/12/11    [provider]  cholecalciferol (VITAMIN D) 1000 UNITS tablet Take 1,000 Units by mouth daily. Patient not taking: Reported on 2019/12/11    [provider]  diphenhydramine-acetaminophen (TYLENOL PM) 25-500 MG TABS tablet Take 2 tablets by mouth at bedtime.    [provider]  Multiple Vitamin (MULTIVITAMIN WITH MINERALS) TABS tablet Take 1 tablet by mouth daily. Patient not taking: Reported on 12-11-19    [provider]  OVER THE COUNTER MEDICATION Apply 1 patch topically daily as needed (back pain). thermacare heat wrap with lidocaine    [provider]  vitamin B-12 (CYANOCOBALAMIN) 1000 MCG tablet Take 1,000 mcg by mouth daily. Patient not taking: Reported on 2019/12/11    [provider]   Physical Exam: Vitals:   11-Dec-2019 1830 11-Dec-2019 1845 December 11, 2019 1922 Dec 11, 2019 1930  BP: (!) 92/41 (!) 87/38 (!) 73/45 (!) 64/46  Pulse: 92 88    Resp: (!) 41 (!) 44    Temp:      TempSrc:      SpO2: 95% 93%    Weight:      Height:       Constitutional: frail, cachectic, pale Eyes: PERRL, lids and conjunctivae normal ENMT: Mucous membranes are dry. Posterior pharynx clear of any exudate or lesions.  Age-appropriate dentition.  Neck: normal, supple, no masses, no thyromegaly Respiratory: clear to auscultation bilaterally, no wheezing, no crackles.  Increase respiratory effort.  accessory muscle use.  Increased respiratory rate Cardiovascular: Irregular rate and irregular rhythm, no murmurs / rubs / gallops. No extremity edema. 2+ pedal pulses. No carotid bruits.   Abdomen: no tenderness, no masses palpated. No hepatosplenomegaly. Bowel sounds positive.  Musculoskeletal: no clubbing / cyanosis. No joint deformity upper and lower extremities. Good ROM, no contractures. Normal muscle tone.  Skin: Pale, no rashes, lesions, ulcers. No induration Neurologic: Sensation intact. Strength 2/5 in all 4.  Psychiatric: Minimally verbal. Alert and oriented x self and tried to tell me her age. Unable to assess mood  Labs on Admission: I have personally reviewed following labs and imaging studies  CBC: Recent Labs  Lab Dec 11, 2019 1057  WBC 8.8  NEUTROABS 7.3  HGB 10.2*  HCT 33.3*  MCV 90.2  PLT 38*   Basic Metabolic Panel: Recent Labs  Lab 12-11-2019 1057  NA 122*  K 5.9*  CL 91*  CO2 <7*  GLUCOSE 87  BUN 75*  CREATININE  4.26*  CALCIUM 8.2*   GFR: Estimated Creatinine Clearance: 8.4 mL/min (A) (by C-G formula based on SCr of 4.26 mg/dL (H)). Liver Function Tests: Recent Labs  Lab 12/11/2019 1057  AST 104*  ALT 40  ALKPHOS 137*  BILITOT 2.3*  PROT 6.2*  ALBUMIN 2.5*   Urine analysis:    Component Value Date/Time   COLORURINE AMBER (A) Dec 11, 2019 1132   APPEARANCEUR CLOUDY (A) 12-11-2019 1132   LABSPEC 1.019 December 11, 2019 1132   PHURINE 5.0 Dec 11, 2019 1132   GLUCOSEU NEGATIVE 12/11/19 1132   HGBUR NEGATIVE 12/11/19 1132   BILIRUBINUR NEGATIVE 12/11/19 1132   KETONESUR NEGATIVE 2019/12/11 1132   PROTEINUR 30 (A) December 11, 2019 1132   NITRITE NEGATIVE 12-11-2019 1132   LEUKOCYTESUR NEGATIVE 2019-12-11 1132   Radiological Exams on Admission: Personally reviewed and I agree with radiologist reading as below.  CT Head Wo Contrast  Result Date: Dec 11, 2019 CLINICAL DATA:  Mental status change, unknown cause. EXAM: CT HEAD WITHOUT CONTRAST TECHNIQUE:  Contiguous axial images were obtained from the base of the skull through the vertex without intravenous contrast. COMPARISON:  No pertinent prior exams are available for comparison. FINDINGS: Brain: Mild generalized cerebral atrophy. Mild ill-defined hypoattenuation within the cerebral white matter is nonspecific, but compatible with chronic small vessel ischemic disease. Bilateral basal ganglia mineralization. There is no acute intracranial hemorrhage. No demarcated cortical infarct. No extra-axial fluid collection. No evidence of intracranial mass. No midline shift. Vascular: No hyperdense vessel.  Atherosclerotic calcifications. Skull: Normal. Negative for fracture or focal lesion. Sinuses/Orbits: Visualized orbits show no acute finding. No significant paranasal sinus disease. IMPRESSION: No evidence of acute intracranial abnormality. Mild cerebral atrophy and chronic small vessel ischemic disease. Electronically Signed   By: Kellie Simmering DO   On: 2019-12-11 11:55   DG Chest Port 1 View  Result Date: 12-11-19 CLINICAL DATA:  Questionable sepsis EXAM: PORTABLE CHEST 1 VIEW COMPARISON:  None. FINDINGS: Patient is rotated. Mild interstitial prominence is probably chronic. No pleural effusion or pneumothorax. Cardiomediastinal contours are within normal limits for technique. IMPRESSION: Mild interstitial prominence is probably chronic. No definite acute process. Electronically Signed   By: Macy Mis M.D.   On: 12-11-19 12:23   CT Renal Stone Study  Addendum Date: 12/11/2019   ADDENDUM REPORT: 11-Dec-2019 14:30 ADDENDUM: Study discussed by telephone with Dr. Lavonia Drafts on 2019/12/11 at 1419 hours. We reviewed that the patient underwent sacroplasty by NIR in September (10/26/2019) following outside Lumbar MRI demonstrating sacral ala fractures. While chronicity of pubic rami fractures is unclear. There is some callus formation about the inferior pubic rami fractures. But the appearance of the superior  rami fractures suggests osteolysis (osteomyelitis) rather than healing. Electronically Signed   By: Genevie Ann M.D.   On: 2019/12/11 14:30   Result Date: 12/11/19 CLINICAL DATA:  86 year old female with sepsis, renal failure, weakness and confusion. Flank pain. EXAM: CT ABDOMEN AND PELVIS WITHOUT CONTRAST TECHNIQUE: Multidetector CT imaging of the abdomen and pelvis was performed following the standard protocol without IV contrast. COMPARISON:  CT Abdomen and Pelvis 09/12/2017. FINDINGS: Lower chest: Mild cardiomegaly is new since 2019. Calcified coronary artery atherosclerosis and/or stents. Calcified aortic atherosclerosis. No pericardial effusion. Small to moderate gastric hiatal hernia has not significantly changed. Lower lung volumes with dependent opacity in the lingula and both lower lobes, but this more resembles atelectasis than pneumonia at this time. No pleural effusion. Hepatobiliary: Negative noncontrast liver. Absent gallbladder as before. Pancreas: Atrophied since 2019. Spleen: Negative. Adrenals/Urinary Tract: Bilateral adrenal gland  thickening such as due to hyperplasia. Noncontrast appearance of both kidneys is stable since 2019. No hydroureter. No evidence of acute obstructive uropathy. Diminutive, unremarkable urinary bladder. Stomach/Bowel: Right colectomy since 2019. There is a small to large bowel anastomosis in the left mid abdomen.: Distal to that is distended with fluid, but at the splenic flexure and distally colon is decompressed. No definite large bowel wall thickening. Diverticulosis in the pelvis. Stomach and duodenum are both mildly distended with fluid, but proximal small bowel loops are decompressed. Fluid-filled small bowel in the pelvis and right abdomen remains within normal limits. Vascular/Lymphatic: Vascular patency is not evaluated in the absence of IV contrast. Extensive Aortoiliac calcified atherosclerosis. No lymphadenopathy. Reproductive: Evidence of pelvic floor laxity.  Surgically absent uterus and diminutive or absent ovaries as before. A diminutive left ovary is noted along the left pelvic side wall near some chronic surgical clips. Other: No free fluid in the pelvis. Musculoskeletal: Chronic lower lumbar spondylolisthesis and advanced disc and posterior element degeneration. A chronic mid sacral fracture is new since 2019, and associated with bilateral sacroplasty since that time. There Is new abnormal gas in the medial left iliac bone abutting the SI joint compatible with osteonecrosis. Nondisplaced fracture there cannot be excluded. However, there is no soft tissue swelling or inflammation adjacent to that abnormal bone. But furthermore, the bilateral pubic rami are shattered with comminution and intra osseous gas tracking toward the left acetabulum. There is abnormal soft tissue gas in an around the fractures including tracking within the medial hip musculature bilaterally. There is also trace gas along the ventral abdominal wall near midline just above the rectus muscle attachment to the comminuted pubic symphysis (series 2, image 57), but this appears to be extraperitoneal, with no evidence of pneumoperitoneum elsewhere. IMPRESSION: 1. Abnormal Pelvis suspicious for Emphysematous Osteomyelitis and Myositis: highly comminuted bilateral pubic rami with abnormal gas in an around the bone fragments and tracking in the bilateral medial hip musculature. Similar abnormal gas within the medial left iliac bone, adjacent to the left SI joint where sequelae of sacral fracture and sacroplasty are noted. Sterile osteonecrosis was considered but unlikely, especially in the setting of sepsis. 2. No acute renal findings.  No evidence of obstructive uropathy. 3. Right colectomy since 2019. Fluid intermittently distend some bowel loops, including the stomach, but there is no strong evidence of mechanical bowel obstruction. 4. Aortic Atherosclerosis (ICD10-I70.0). Hiatal hernia. Residual  large bowel diverticulosis. Electronically Signed: By: Genevie Ann M.D. On: Dec 31, 2019 14:15   EKG: Independently reviewed, showing atrial fibrillation with a rate of 106.  No ST elevation.  Assessment/Plan  Principal Problem:   Sepsis ([place]) Active Problems:   AKI (acute kidney injury) ([place])   LFT elevation   H/O kyphoplasty   Essential hypertension   Change in mental status   Thrombocytopenia ([facility])   Septic shock - not reponsive to IVF and bolus -Etiology unclear at this time, differentials include bacteremia, osteomyelitis, pyelonephritis, -Elevated lactic acid greater than 11, and hypothermia  -Status post cefepime, metronidazole, vancomycin IV per ED provider -Continue ceftriaxone IV 1 g to cover for pyelonephritis -Blood cultures obtained -Urine culture obtained -No leukocytosis - Levophed gtt ordered - ICU admission - Blood gas stat ordered - Critical care was consulted   New atrial fibrillation-echo ordered, suspect secondary to sepsis  Severe Thrombocytopenia - etiology unclear at this time, ordering repeat CBC with diff stat - differential includes pseudothrombocytopenia vs HUS  Sacral OM - suspect - Ortho has been consulted by  ED provider   AKI-multifactorial including sepsis with dehydration -Status post 3 L fluid -Continue LR L IVF  Electrolyte imbalance: Hyponatremia hyperkalemia hypochloremia -suspect secondary to dehydration  CRITICAL CARE Performed by: Rupert Stacks  Total critical care time: 45 minutes  Critical care time was necessary to treat or prevent imminent or life-threatening deterioration.  Critical care time was time spent personally by me on the following activities: Development of treatment plan with patient and/or surrogate as well as nursing, discussions with consultants, evaluation of patient's response to treatment, examination of patient, obtaining history from patient or surrogate, ordering and performing treatments and interventions, ordering  and review of laboratory studies, ordering and review of radiographic studies, pulse oximetry and reevaluation of patient's condition.  DVT prophylaxis: Holding due to thrombocytopenia Code Status: DNR Diet: N.p.o. Family Communication: Discussed with second healthcare power of attorney, Netty Starring and Carley Hammed (daughter of Doreene Nest, who is first healthcare power of attorney) Disposition Plan: Poor prognosis Consults called: Critical care Admission status: Critical care unit  Rosalind Guido N [place] Chelf D.O. [facility]  If 7AM-7PM, please contact day-coverage provider [email or web address]  2019-12-17, 7:38 PM

## 2020-01-05 NOTE — ED Notes (Signed)
Pt's blood pressure no longer readable. Pt noted to be in asystole on the monitor and no palpable pulse found at this time. Dr. Tobie Poet at bedside for time of death at 48. Pt has DNR in place with copy at bedside.

## 2020-01-05 NOTE — ED Notes (Signed)
Pt taken for CT 

## 2020-01-05 NOTE — ED Triage Notes (Signed)
Pt arrives via EMS from home after family states she has not been herself the last couple days- EMS was called out yesterday for pain but pt did not want to go- pt had surgery 3 weeks ago where she got cement placed in her bone in her bottom- pt not answering questions and is moaning- pt coo and clammy to touch- pt has also been constipated d/t pain meds

## 2020-01-05 NOTE — ED Notes (Signed)
Per pt family pt has a known UTI but has not been taking her antibiotic- pt was diagnosed October 25th

## 2020-01-05 NOTE — ED Notes (Signed)
Pt placed on bear hugger.  

## 2020-01-05 NOTE — Progress Notes (Signed)
CODE SEPSIS - PHARMACY COMMUNICATION  **Broad Spectrum Antibiotics should be administered within 1 hour of Sepsis diagnosis**  Time Code Sepsis Called/Page Received: 11:58  Antibiotics Ordered: Cefepime, Vancomycin, Metronidazole  Time of 1st antibiotic administration: Cefepime given at 12:25  Additional action taken by pharmacy: n/a   If necessary, Name of Provider/Nurse Contacted: n/a    Vira Blanco ,PharmD Clinical Pharmacist  12/17/2019  12:23 PM

## 2020-01-05 NOTE — Discharge Summary (Signed)
Death summary  Rebecca Sellers is an 86 year old female with medical history of hypertension, hypothyroid, hyperlipidemia, recent sacral fracture status post sacral plasty approximately 2 to 4 weeks ago presented to the emergency department for chief concern of mental status change.  Patient was recently diagnosed with UTI on 11/29/2019.  Per housekeeper/cleaning lady, and antibiotic prescription was found and per day at diagnosis and filling of the prescription, until day of hospital presentation it appears that patient did not take the antibiotic as prescribed, underdosing.  Ms. Rebecca Sellers the second healthcare power of attorney was not able to tell me the name of the antibiotic.  Patient was found to be in septic shock not responsive to bolus fluid and IVF, map in the 40s and 60s.  Etiology include pyelonephritis, bacteremia, osteomyelitis.  She was started on Levophed gtt., admitted to ICU, stat ABG was ordered, and critical care was consulted.  Levophed was started and patient's blood pressure continued to decline.  Physical exam patient is DNR.  Patient remained unresponsive to noxious stimuli Pupils fixed and dilated, no corneal response No spontaneous respirations No auscultation of heart sounds No auscultated respirations. No auscultated bowel sounds. No palpable pulse in the right carotid or right femoral Patient pronounced deceased at [place] on December 17, 2019.  Blood relatives were not present at time of death.  Second healthcare power of Rebecca Sellers was present at time of death.  Cause of death: Septic shock secondary to E. coli bacteremia.  Breanna Mcdaniel, DO [facility]

## 2020-01-05 NOTE — Progress Notes (Signed)
eLink is monitoring this sepsis. Thank you 

## 2020-01-05 NOTE — Progress Notes (Signed)
PHARMACY -  BRIEF ANTIBIOTIC NOTE   Pharmacy has received consult(s) for Cefepime and Vancomycin from an ED provider.  The patient's profile has been reviewed for ht/wt/allergies/indication/available labs.    One time order(s) placed for Cefepime 2g and Vancomycin 1g by ED provider.  Further antibiotics/pharmacy consults should be ordered by admitting physician if indicated.                       Thank you, Vira Blanco Jan 07, 2020  12:23 PM

## 2020-01-05 NOTE — ED Notes (Signed)
Dr. Tobie Poet at bedside. Pt still hypotensive and remains bradycardic in the 40's.

## 2020-01-05 NOTE — ED Notes (Signed)
Pt's blood pressure continues to decrease despite several increased doseage of Levophed. Pt's heart rate noted to have episodes of bradycardia into the 50's and 40's. Pt continues to be unresponsive at this time. Dr. Tobie Poet notified.

## 2020-01-05 NOTE — ED Provider Notes (Addendum)
[facility] Emergency Department Provider Note   ____________________________________________    I have reviewed the triage vital signs and the nursing notes.   HISTORY  Chief Complaint Altered Mental Status   History limited by altered mental status  HPI Rebecca Sellers is a 86 y.o. female with a history of diabetes who presents with altered mental status.  Patient reportedly found to be significantly confused today.  No further history is available from EMS except that patient had reported kyphoplasty several weeks ago  Past Medical History:  Diagnosis Date  . Bilateral swelling of feet   . Bladder problem   . Bruises easily   . Cancer ([place])    colon  . Carpal tunnel syndrome 02/20/2015   Bilateral  . Colon cancer ([place]) 09/29/2017   surgery only  . Diabetes ([place])    no meds.  . Fatigue   . GERD (gastroesophageal reflux disease)   . Hypertension   . Hypothyroidism   . Memory loss   . Osteoarthritis   . Osteoporosis   . Reflux   . Thyroid disease     Patient Active Problem List   Diagnosis Date Noted  . S/P right colectomy August 2019 09/29/2017  . Carpal tunnel syndrome 02/20/2015    Past Surgical History:  Procedure Laterality Date  . ABDOMINAL HYSTERECTOMY    . CHOLECYSTECTOMY    . EYE SURGERY     bilateral cataract with lens implnats  . IR SACROPLASTY BILATERAL  10/26/2019  . LAPAROSCOPIC PARTIAL COLECTOMY N/A 09/29/2017   Procedure: LAPAROSCOPIC PARTIAL COLECTOMY;  Surgeon: Johnathan Hausen, MD;  Location: [facility] ORS;  Service: General;  Laterality: N/A;    Prior to Admission medications   Medication Sig Start Date End Date Taking? Authorizing Provider  acetaminophen (TYLENOL) 650 MG CR tablet Take 1,300 mg by mouth every 8 (eight) hours as needed for pain.    [provider]  amLODipine (NORVASC) 2.5 MG tablet Take 2.5 mg by mouth daily.    [provider]  ascorbic acid (VITAMIN C) 500 MG tablet Take 500 mg  by mouth daily.    [provider]  cholecalciferol (VITAMIN D) 1000 UNITS tablet Take 1,000 Units by mouth daily.    [provider]  diphenhydramine-acetaminophen (TYLENOL PM) 25-500 MG TABS tablet Take 2 tablets by mouth at bedtime.    [provider]  fenofibrate 160 MG tablet Take 160 mg by mouth daily.    [provider]  fluticasone (FLONASE) 50 MCG/ACT nasal spray Place 2 sprays into both nostrils daily as needed for allergies.     [provider]  HYDROcodone-acetaminophen (NORCO) 10-325 MG tablet Take 1 tablet by mouth every 6 (six) hours as needed for moderate pain.    [provider]  levothyroxine (SYNTHROID, LEVOTHROID) 75 MCG tablet Take 75 mcg by mouth daily before breakfast.    [provider]  lisinopril (PRINIVIL,ZESTRIL) 40 MG tablet Take 40 mg by mouth daily.    [provider]  Multiple Vitamin (MULTIVITAMIN WITH MINERALS) TABS tablet Take 1 tablet by mouth daily.    [provider]  omeprazole (PRILOSEC) 20 MG capsule Take 20 mg by mouth daily.    [provider]  OVER THE COUNTER MEDICATION Apply 1 patch topically daily as needed (back pain). thermacare heat wrap with lidocaine    [provider]  Polyethyl Glycol-Propyl Glycol (SYSTANE OP) Place 1 drop into both eyes 3 (three) times daily.  [provider]  pravastatin (PRAVACHOL) 10 MG tablet Take 10 mg by mouth daily.    [provider]  vitamin B-12 (CYANOCOBALAMIN) 1000 MCG tablet Take 1,000 mcg by mouth daily.    [provider]     Allergies Alprazolam, Garlic, Mobic [meloxicam], Relafen [nabumetone], Sulfa antibiotics, and Trazodone and nefazodone  Family History  Problem Relation Age of Onset  . Breast cancer Mother 72  . Heart disease Father     Social History Social History   Tobacco Use  . Smoking status: Former Smoker    Types: Cigarettes  . Smokeless tobacco: Never Used    Vaping Use  . Vaping Use: Never used  Substance Use Topics  . Alcohol use: Yes    Comment: social  . Drug use: No    Unable to obtain review of Systems     ____________________________________________   PHYSICAL EXAM:  VITAL SIGNS: ED Triage Vitals  Enc Vitals Group     BP 2019-12-25 1119 (!) 152/74     Pulse Rate 2019-12-25 1051 (!) 121     Resp 12/25/19 1051 (!) 33     Temp 2019/12/25 1104 (!) 93 F (33.9 C)     Temp Source 2019/12/25 1104 Rectal     SpO2 12-25-19 1235 97 %     Weight 12/25/19 1052 62.5 kg (137 lb 12.6 oz)     Height 25-Dec-2019 1052 1.651 m (5\' 5" )     Head Circumference --      Peak Flow --      Pain Score --      Pain Loc --      Pain Edu? --      Excl. in [place]? --     Constitutional: Alert, does seem to nod in response to questions Eyes: Conjunctivae are normal.  Head: Atraumatic. Nose: No congestion/rhinnorhea. Mouth/Throat: Mucous membranes are extremely dry Neck:  Painless ROM Cardiovascular: Tachycardic, regular rhythm. Grossly normal heart sounds.  Good peripheral circulation. Respiratory: Increased respiratory effort with tachypnea t.  No retractions.  No rales Gastrointestinal: Soft and nontender. No distention.    Musculoskeletal: Warm and well perfused Neurologic: Moves all extremities equally Skin:  Skin is warm, dry and intact. No rash noted. Psychiatric:   ____________________________________________   LABS (all labs ordered are listed, but only abnormal results are displayed)  Labs Reviewed  LACTIC ACID, PLASMA - Abnormal; Notable for the following components:      Result Value   Lactic Acid, Venous >11.0 (*)    All other components within normal limits  COMPREHENSIVE METABOLIC PANEL - Abnormal; Notable for the following components:   Sodium 122 (*)    Potassium 5.9 (*)    Chloride 91 (*)    CO2 <7 (*)    BUN 75 (*)    Creatinine, Ser 4.26 (*)    Calcium 8.2 (*)    Total Protein 6.2 (*)    Albumin 2.5 (*)    AST 104 (*)     Alkaline Phosphatase 137 (*)    Total Bilirubin 2.3 (*)    GFR, Estimated 10 (*)    All other components within normal limits  CBC WITH DIFFERENTIAL/PLATELET - Abnormal; Notable for the following components:   RBC 3.69 (*)    Hemoglobin 10.2 (*)    HCT 33.3 (*)    RDW 16.3 (*)    Platelets 38 (*)    nRBC 0.7 (*)    Abs Immature Granulocytes 0.17 (*)    All other  components within normal limits  URINALYSIS, COMPLETE (UACMP) WITH MICROSCOPIC - Abnormal; Notable for the following components:   Color, Urine AMBER (*)    APPearance CLOUDY (*)    Protein, ur 30 (*)    Bacteria, UA RARE (*)    All other components within normal limits  CULTURE, BLOOD (SINGLE)  URINE CULTURE  CULTURE, BLOOD (SINGLE)  RESPIRATORY PANEL BY RT PCR (FLU A&B, COVID)  LACTIC ACID, PLASMA  PROTIME-INR  APTT   ____________________________________________  EKG  ED ECG REPORT I, Lavonia Drafts, the attending physician, personally viewed and interpreted this ECG.  Date: 12-26-19  Rhythm: Atrial fibrillation QRS Axis: normal Intervals: Abnormal ST/T Wave abnormalities: normal Narrative Interpretation: no evidence of acute ischemia  ____________________________________________  RADIOLOGY  Chest x-ray viewed by me, no infiltrate or pneumonia CT head reviewed by me, no bleed, pending radiology read ____________________________________________   PROCEDURES  Procedure(s) performed: No  Procedures   Critical Care performed: yes  CRITICAL CARE Performed by: Lavonia Drafts   Total critical care time: 45 minutes  Critical care time was exclusive of separately billable procedures and treating other patients.  Critical care was necessary to treat or prevent imminent or life-threatening deterioration.  Critical care was time spent personally by me on the following activities: development of treatment plan with patient and/or surrogate as well as nursing, discussions with consultants,  evaluation of patient's response to treatment, examination of patient, obtaining history from patient or surrogate, ordering and performing treatments and interventions, ordering and review of laboratory studies, ordering and review of radiographic studies, pulse oximetry and re-evaluation of patient's condition.  ____________________________________________   INITIAL IMPRESSION / ASSESSMENT AND PLAN / ED COURSE  Pertinent labs & imaging results that were available during my care of the patient were reviewed by me and considered in my medical decision making (see chart for details).  Patient presents with altered mental status as noted above.  Differential is extensive including sepsis, intracranial event, electrolyte abnormalities, etc.  Found to be initially hypothermic, will place bear hugger, concern for sepsis.  Pending labs, CT, x-ray  Chest x-ray read by me, no infiltrate or effusion noted.  Patient with normal white blood cell count however notified of elevated lactic greater than 11, broad-spectrum antibiotics ordered for likely severe sepsis, 30 mils per kilogram fluids ordered as well.  CMP demonstrates significant hyponatremia, mild hyperkalemia (no EKG changes), acidosis with BUN of 75 and creatinine of 4.26.  Friend of patient and medical power of attorney is here now who reports that patient was complaining of weakness last night, had EMS called out and they told patient that she would have a long wait so she did not come  Found to be altered this morning.  Discussed with medical POA that patient's prognosis is serious and that she will require admission for further treatment   ED Sepsis - Repeat Assessment   Performed at:    1330  Last Vitals:    Blood pressure (!) 112/38, pulse 64, temperature (!) 93 F (33.9 C), temperature source Rectal, resp. rate (!) 52, height 1.651 m (5\' 5" ), weight 62.5 kg, SpO2 97 %.  Heart:      Irregular rhythm   Lungs:     Cta  b/l  Capillary Refill:   3+  Peripheral Pulse (include location): Right ulnar pulse     Skin (include color):   Pale   Discussed with hospitalist who will admit, requested CT to be ordered, she will follow-up on results  Discussed  with Dr. Roland Rack of orthopedics, recommends antibiotics and stabilization at this time     ____________________________________________   FINAL CLINICAL IMPRESSION(S) / ED DIAGNOSES  Final diagnoses:  Sepsis with encephalopathy, due to unspecified organism, unspecified whether septic shock present [facility])        Note:  This document was prepared using Dragon voice recognition software and may include unintentional dictation errors.   Lavonia Drafts, MD 01-03-2020 1328    Lavonia Drafts, MD Jan 03, 2020 1337    Lavonia Drafts, MD January 03, 2020 1436

## 2020-01-05 DEATH — deceased
